# Patient Record
Sex: Female | Born: 1970 | Race: White | Hispanic: No | Marital: Married | Smoking: Former smoker
Health system: Southern US, Community
[De-identification: ages and names within clinical notes are randomized; demographics above are authoritative.]

## PROBLEM LIST (undated history)

## (undated) DIAGNOSIS — J302 Other seasonal allergic rhinitis: Secondary | ICD-10-CM

## (undated) DIAGNOSIS — M1990 Unspecified osteoarthritis, unspecified site: Secondary | ICD-10-CM

## (undated) DIAGNOSIS — F419 Anxiety disorder, unspecified: Secondary | ICD-10-CM

## (undated) DIAGNOSIS — F329 Major depressive disorder, single episode, unspecified: Secondary | ICD-10-CM

## (undated) DIAGNOSIS — R011 Cardiac murmur, unspecified: Secondary | ICD-10-CM

## (undated) DIAGNOSIS — F32A Depression, unspecified: Secondary | ICD-10-CM

## (undated) DIAGNOSIS — Z780 Asymptomatic menopausal state: Secondary | ICD-10-CM

## (undated) DIAGNOSIS — F99 Mental disorder, not otherwise specified: Secondary | ICD-10-CM

## (undated) HISTORY — PX: KNEE ARTHROSCOPY: SHX127

---

## 1999-11-24 ENCOUNTER — Encounter: Payer: Self-pay | Admitting: Emergency Medicine

## 1999-11-25 ENCOUNTER — Encounter: Payer: Self-pay | Admitting: Family Medicine

## 1999-11-25 ENCOUNTER — Inpatient Hospital Stay (HOSPITAL_COMMUNITY): Admission: EM | Admit: 1999-11-25 | Discharge: 1999-11-27 | Payer: Self-pay | Admitting: Emergency Medicine

## 2000-07-31 ENCOUNTER — Other Ambulatory Visit: Admission: RE | Admit: 2000-07-31 | Discharge: 2000-07-31 | Payer: Self-pay | Admitting: Family Medicine

## 2002-09-16 HISTORY — PX: BREAST ENHANCEMENT SURGERY: SHX7

## 2004-05-29 ENCOUNTER — Other Ambulatory Visit: Admission: RE | Admit: 2004-05-29 | Discharge: 2004-05-29 | Payer: Self-pay | Admitting: Family Medicine

## 2007-05-01 ENCOUNTER — Other Ambulatory Visit: Admission: RE | Admit: 2007-05-01 | Discharge: 2007-05-01 | Payer: Self-pay | Admitting: Family Medicine

## 2008-05-24 ENCOUNTER — Other Ambulatory Visit: Admission: RE | Admit: 2008-05-24 | Discharge: 2008-05-24 | Payer: Self-pay | Admitting: Family Medicine

## 2009-07-10 ENCOUNTER — Other Ambulatory Visit: Admission: RE | Admit: 2009-07-10 | Discharge: 2009-07-10 | Payer: Self-pay | Admitting: Family Medicine

## 2011-02-01 NOTE — H&P (Signed)
[place]  Patient:    Sharon Chapman, Sharon Chapman                    MRN: [id number] Adm. Date:  [id number] Attending:  Allean Found CC:         [facility]             Rocco Pauls, M.D., [facility], [place]                         History and Physical  DATE OF BIRTH:  1971-01-20.  CHIEF COMPLAINT:  Nausea and vomiting.  DIAGNOSIS ON ADMISSION:  Leukocytosis, fever, nausea and vomiting.  SUBJECTIVE:  Patient is a 40 year old female who comes in to [facility] ER with a two-day history of nausea and vomiting.  Patient underwent an abdominoplasty and liposuction on November 20, 1999 by Dr. Rocco Pauls, in [place], [place].  This was done as an outpatient and patient returned to [facility] on  November 22, 1999, two days later.  Patient was feeling well except for expected post-surgical pain until November 23, 1999.  Patient was on Percocet for pain and Keflex for postop precautions.  Patient took a Dulcolax on November 23, 1999 p.m. and developed diarrhea.  Fever began Thursday evening.  Patients husband spoke with  Dr. Burnadette Pop office and was told fever was to be expected; however, fever continued to climb up to 102 and 103 today, continued with nausea and vomiting and abdominal pain.  No pain with urination.  Drains are currently in place with serous bloody-appearing fluid.  PAST MEDICAL HISTORY:  Surgeries:  C-sections x 2.  On March 6th of this past week, she had abdominoplasty, liposuction of legs, stomach and buttocks.  She has occasional alcohol use.  She does smoke one and a half packs of cigarettes per ay. No street drugs.  CURRENT MEDICATIONS:  Percocet and Keflex.  SOCIAL HISTORY:  Patient is married, for the past eight years.  Children, ages  and 2.  She is a stay-at-home mom.  FAMILY HISTORY:  Hypertension, but she is not sure who in the family.  No one with coronary artery disease.  She is not sure  about cholesterol.  She has a maternal aunt with diabetes.  Her mom has breast cancer.  REVIEW OF SYSTEMS:  Patient complains of nausea, vomiting, abdominal tenderness and has had fever.  PHYSICAL EXAMINATION:  VITAL SIGNS:  Initial vital signs in the emergency room:  Temperature 99.5, blood pressure 88/54, pulse of 107, respirations of 20.  GENERAL:  She is an alert patient in obvious discomfort.  TMs are normal. Throat is clear.  NECK:  Supple without lymphadenopathy.  HEENT:  Cranial nerves II-XI are intact.  Pupils equal, round and reactive to light.  Extraocular muscles intact bilaterally.  LUNGS:  Clear to auscultation.  HEART:  Regular rate and rhythm without murmur.  ABDOMEN:  Multiple bruises from the previous surgery.  Drains are in place. Skin very warm to touch.  She has no obvious erythema.  Serous fluid is draining. She has positive tenderness on palpation in the right and left upper quadrants ut no hepatosplenomegaly.  There is numbness on palpation over the incision site.   EXTREMITIES:  She has bruising on her thighs and posterior buttocks. NEUROLOGIC:  Reflexes are intact.  PELVIC:  Deferred.  RECTAL:  Deferred.  LABORATORY WORK:  Her white count  is 18,300 with a left shift and 82 segs, 6 lymphocytes, 10 monos.  Her hemoglobin is 12.3.  She has a low potassium at 3.1. Otherwise, the rest of her lab work is normal.  Her blood cultures are pending 2. Her urinalysis was normal.  ASSESSMENT:  Leukocytosis in a 40 year old female, status post abdominoplasty and liposuction.  No source of infection currently found.  She is having nausea and  vomiting.  Etiology could be secondary to Percocet intolerance, gastrointestinal virus or early wound infection.  Patient received 3 liters of fluid in the emergency room.  PLAN:  Blood cultures.  Begin Rocephin -- she had received 1 g of Rocephin in the emergency room and then will continue at 750 mg IV  q.8h.  Continue pain management with morphine IV, nausea management and slowly increase p.o. intake.  She has a low potassium -- add potassium to her fluids.  Will obtain a surgical consult in the normal hours of the day.  She will be placed on telemetry and surgical service s precautionary and patient was encouraged to quit smoking. DD:  11/25/99 TD:  11/25/99 Job: [id number]

## 2011-02-01 NOTE — Consult Note (Signed)
[place]  Patient:    Sharon Chapman, Sharon Chapman                    MRN: [id number] Proc. Date: 11/25/99 Adm. Date:  [id number] Attending:  Rosanne Sack                          Consultation Report  CHIEF COMPLAINT:  Status post abdominoplasty with fever, nausea, and vomiting.  HISTORY OF PRESENT ILLNESS:  A 40 year old woman who is status post mini abdominoplasty with flank and buttock liposuction five days ago in [place], [place].  She did well for two days and then developed nausea, vomiting,  diarrhea worsened with Dulcolax as would be expected.  Fever to 102 degrees. Unable to reach a Engineer, petroleum at [facility] and was admitted early morning 25 November 1999 to [facility].  Plastic surgery consultation requested to evaluate wound.  The patient reports slight improvement since her admission in terms of her symptoms.  PAST MEDICAL HISTORY:  Essentially negative.  SOCIAL HISTORY:  Married, two children, one and one-half pack per day smoker.  PHYSICAL EXAMINATION:  ABDOMEN:  Soft.  Drain is functioning, and the drainage appears appropriate in color and amount.  The wound seems to be healing well.  No evidence of abscess,  fluid accumulation, or cellulitis.  LOWER EXTREMITIES:  No evidence of DVT, no swelling, no tenderness, no pain on dorsiflexion.  IMPRESSION:  Postoperative mini abdominoplasty and liposuction, possible hypovolemia with less than adequate fluid resuscitation at the time of surgery.  Possible atelectasis given the length of surgery and history.  Possible reaction to antibiotics including possible colitis.  RECOMMENDATIONS:  Continue IV hydration, and continue the antibiotics for another 24 hours and then stop the antibiotics unless a positive blood culture.  No wound care per se is needed.  The wound seems to be doing well.  Empty the drain every shift.  Close followup by plastic  surgeon in [facility] next week after discharge from the hospital. DD:  11/26/99 TD:  11/26/99 Job: [id number]

## 2011-02-04 ENCOUNTER — Encounter (INDEPENDENT_AMBULATORY_CARE_PROVIDER_SITE_OTHER): Payer: Self-pay | Admitting: General Surgery

## 2011-07-29 ENCOUNTER — Other Ambulatory Visit (HOSPITAL_COMMUNITY)
Admission: RE | Admit: 2011-07-29 | Discharge: 2011-07-29 | Disposition: A | Discharge status: Home / Self Care | Payer: 59 | Source: Ambulatory Visit | Attending: Family Medicine | Admitting: Family Medicine

## 2011-07-29 ENCOUNTER — Other Ambulatory Visit: Payer: Self-pay | Admitting: Physician Assistant

## 2011-07-29 DIAGNOSIS — Z0000 Encounter for general adult medical examination without abnormal findings: Secondary | ICD-10-CM | POA: Insufficient documentation

## 2012-05-04 ENCOUNTER — Encounter (HOSPITAL_COMMUNITY): Payer: Self-pay | Admitting: Emergency Medicine

## 2012-05-04 ENCOUNTER — Emergency Department (HOSPITAL_COMMUNITY): Payer: 59

## 2012-05-04 ENCOUNTER — Emergency Department (HOSPITAL_COMMUNITY)
Admission: EM | Admit: 2012-05-04 | Discharge: 2012-05-04 | Disposition: A | Discharge status: Home / Self Care | Payer: 59 | Attending: Emergency Medicine | Admitting: Emergency Medicine

## 2012-05-04 DIAGNOSIS — M542 Cervicalgia: Secondary | ICD-10-CM | POA: Insufficient documentation

## 2012-05-04 DIAGNOSIS — Z7982 Long term (current) use of aspirin: Secondary | ICD-10-CM | POA: Insufficient documentation

## 2012-05-04 DIAGNOSIS — IMO0002 Reserved for concepts with insufficient information to code with codable children: Secondary | ICD-10-CM | POA: Insufficient documentation

## 2012-05-04 DIAGNOSIS — M5412 Radiculopathy, cervical region: Secondary | ICD-10-CM | POA: Insufficient documentation

## 2012-05-04 MED ORDER — HYDROMORPHONE HCL PF 2 MG/ML IJ SOLN
2.0000 mg | Freq: Once | INTRAMUSCULAR | Status: AC
  Administered 2012-05-04: 2 mg via INTRAMUSCULAR
  Filled 2012-05-04: qty 1

## 2012-05-04 MED ORDER — KETOROLAC TROMETHAMINE 30 MG/ML IJ SOLN
60.0000 mg | Freq: Once | INTRAMUSCULAR | Status: AC
  Administered 2012-05-04: 60 mg via INTRAMUSCULAR
  Filled 2012-05-04 (×2): qty 1

## 2012-05-04 MED ORDER — OXYCODONE-ACETAMINOPHEN 5-325 MG PO TABS: 1.0000 | ORAL_TABLET | ORAL | Status: AC | PRN

## 2012-05-04 MED ORDER — PREDNISONE 20 MG PO TABS: 40.0000 mg | ORAL_TABLET | Freq: Every day | ORAL | Status: AC

## 2012-05-04 NOTE — ED Notes (Signed)
Pt c/o right neck and shoulder pain with numbness and tingling to right hand x 2 days

## 2012-05-04 NOTE — ED Notes (Signed)
Pt returned from MRI, requesting pain med. Rates pain #5 on pain scale 0-10

## 2012-05-04 NOTE — ED Provider Notes (Signed)
History   This chart was scribed for Sharon Chapman. Bernette Mayers, MD by Sharon Chapman. The patient was seen in room TR07C/TR07C and the patient's care was started at 7:02PM.    CSN: [id number]  Arrival date & time 05/04/12  1823   None     Chief Complaint  Patient presents with  . Shoulder Pain    (Consider location/radiation/quality/duration/timing/severity/associated sxs/prior treatment) The history is provided by the spouse and the patient. No language interpreter was used.   Sharon Chapman is a 41 y.o. female who presents to the Emergency Department complaining of constant, moderate right shoulder pain that radiates to the neck and down the arm with numbness and tinging in the right hand with an onset 2 days ago. Pt has had this pain for 2 months. Pt has been recently travelling in [place] the past 2 weeks and the pain progressively got worse around onset. Pt went to Urgent Care recently and received XRs. No trauma or injury to the affected area. Ibuprofen does not alleviate the pain. No HA, fever, neck pain, sore throat, rash, back pain, CP, SOB, abd pain, n/v/d, dysuria, or extremity edema or weakness. No known allergies. No other pertinent medical symptoms.   History reviewed. No pertinent past medical history.  Past Surgical History  Procedure Date  . Breast enhancement surgery 2004  . Cesarean section     History reviewed. No pertinent family history.  History  Substance Use Topics  . Smoking status: Never Smoker   . Smokeless tobacco: Not on file  . Alcohol Use: Yes     couple glasses of wine several days of week    OB History    Grav Para Term Preterm Abortions TAB SAB Ect Mult Living                  Review of Systems 10 Systems reviewed and all are negative for acute change except as noted in the HPI.   Allergies  Review of patient's allergies indicates no known allergies.  Home Medications   Current Outpatient Rx  Name Route Sig Dispense Refill  .  ASPIRIN 325 MG PO TABS Oral Take 650 mg by mouth daily.    Marland Kitchen ESCITALOPRAM OXALATE 10 MG PO TABS Oral Take 10 mg by mouth daily.    . IBUPROFEN 200 MG PO TABS Oral Take 600 mg by mouth every 6 (six) hours as needed. For pain      BP 176/88  Pulse 63  Temp 98.2 F (36.8 C) (Oral)  Resp 20  SpO2 100%  Physical Exam  Nursing note and vitals reviewed. Constitutional: She is oriented to person, place, and time. She appears well-developed and well-nourished.  HENT:  Head: Normocephalic and atraumatic.  Eyes: EOM are normal. Pupils are equal, round, and reactive to light.  Neck: Normal range of motion. Neck supple.  Cardiovascular: Normal rate, normal heart sounds and intact distal pulses.   Pulmonary/Chest: Effort normal and breath sounds normal.  Abdominal: Bowel sounds are normal. She exhibits no distension. There is no tenderness.  Musculoskeletal: Normal range of motion. She exhibits tenderness (Right shoulder and neck muscular tenderness). She exhibits no edema.  Neurological: She is alert and oriented to person, place, and time. She has normal strength. No cranial nerve deficit or sensory deficit.       Nml strength and sensation in all extremities.  Skin: Skin is warm and dry. No rash noted.  Psychiatric: She has a normal mood and affect.  ED Course  Procedures (including critical care time)  DIAGNOSTIC STUDIES: Oxygen Saturation is 100% on room air, normal by my interpretation.    COORDINATION OF CARE:  7:15PM - Toradol and dilaudid will be ordered for the pt.  WEST, Sharon Chapman 8:08 PM Patient moved to CDU holding for MRI c-spine.  Pt with weeks to months of right radicular symptoms, concern per Dr Bernette Chapman of abnormality of c-spine film.  Pt reports pain is currently well controlled, denies any weakness of the extremity.  On exam, c-spine and t-spine are nontender, right arm with 5/5 strength, grip strengths equal, distal pulses intact.      Labs Reviewed - No data to  display No results found.   No diagnosis found.    MDM  Pt with radicular pain in R arm, plain xray done at urgent care shows ?compression fracture. No recent trauma but fell several months ago. Move to CDU for MRI. Discussed with Sharon Dredge, PA  I personally performed the services described in the documentation, which were scribed in my presence. The recorded information has been reviewed and considered.          Sharon Chapman. Bernette Mayers, MD 05/04/12 2027

## 2012-05-04 NOTE — ED Notes (Signed)
Pt to MRI

## 2012-05-04 NOTE — ED Provider Notes (Signed)
Patient moved to CDU holding for MRI c-spine.  Please see my initial note which in within Dr Janese Banks original note.  Pt with right sided cervical radiculopathy with concern for abnormal xray.  No weakness no exam.  Pain well controlled in ED.  Awaiting MRI.    10:44 PM Discussed MRI results with patient and her family member.  Dr Bernette Mayers has also reviewed MRI.  Answered all question.  Pt to be d/c home with symptomatic medications, neurosurgery follow up.  Pt given return precautions.  Pt verbalizes understanding and agrees with plan.     No results found for this or any previous visit. Mr Cervical Spine Wo Contrast  05/04/2012  *RADIOLOGY REPORT*  Clinical Data: Right shoulder and arm pain.  Question fracture on outside radiographs which are not available.  MRI CERVICAL SPINE WITHOUT CONTRAST  Technique:  Multiplanar and multiecho pulse sequences of the cervical spine, to include the craniocervical junction and cervicothoracic junction, were obtained according to standard protocol without intravenous contrast.  Comparison: None.  Findings: Mild cervical kyphosis.  Negative for fracture.  Negative for mass.  No bone marrow edema.  Spinal cord signal is normal.  C2-3:  Negative  C3-4:  Small central disc protrusion without spinal stenosis or cord deformity.  C4-5:  Small central disc protrusion without spinal stenosis.  C5-6:  Moderate to severe disc degeneration and spondylosis with diffuse uncinate spurring.  The patient has a generous spinal canal and there is no significant spinal stenosis.  There is moderate foraminal encroachment bilaterally due to spurring.  C6-7:  Diffuse disc protrusion and associated osteophyte formation causing mild spinal stenosis.  Mild to moderate foraminal stenosis bilaterally.  C7-T1:  Negative  IMPRESSION: Negative for fracture or mass.  Small central disc protrusions at C3-4 and C4-5.  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  Diffuse disc protrusion and  spondylosis at C6-7 with mild spinal stenosis and mild to moderate foraminal encroachment bilaterally.   Original Report Authenticated By: Camelia Phenes, M.D.       [place], [place] 05/04/12 2246

## 2012-05-04 NOTE — ED Notes (Signed)
Sent from MD's office with xrays. C/o intermittent right shoulder pain radiating into RUE x 2-3 months with numbness & tingling. Reports pain has been constant since yesterday.  Strength + BUE, states worst of the pain stops at mid forearm. C/o pins & needles to right fingertips. Pt cannot recall any injury

## 2012-05-15 ENCOUNTER — Other Ambulatory Visit: Payer: Self-pay | Admitting: Neurosurgery

## 2012-05-21 ENCOUNTER — Encounter (HOSPITAL_COMMUNITY): Payer: Self-pay | Admitting: Pharmacy Technician

## 2012-05-25 ENCOUNTER — Encounter (HOSPITAL_COMMUNITY): Payer: Self-pay

## 2012-05-25 ENCOUNTER — Encounter (HOSPITAL_COMMUNITY)
Admission: RE | Admit: 2012-05-25 | Discharge: 2012-05-25 | Disposition: A | Discharge status: Home / Self Care | Payer: 59 | Source: Ambulatory Visit | Attending: Neurosurgery | Admitting: Neurosurgery

## 2012-05-25 HISTORY — DX: Depression, unspecified: F32.A

## 2012-05-25 HISTORY — DX: Anxiety disorder, unspecified: F41.9

## 2012-05-25 HISTORY — DX: Unspecified osteoarthritis, unspecified site: M19.90

## 2012-05-25 HISTORY — DX: Cardiac murmur, unspecified: R01.1

## 2012-05-25 HISTORY — DX: Major depressive disorder, single episode, unspecified: F32.9

## 2012-05-25 HISTORY — DX: Mental disorder, not otherwise specified: F99

## 2012-05-25 LAB — BASIC METABOLIC PANEL
BUN: 7 mg/dL (ref 6–23)
Chloride: 103 mEq/L (ref 96–112)
GFR calc Af Amer: >90 mL/min (ref >90)
GFR calc non Af Amer: >90 mL/min (ref >90)
Glucose, Bld: 79 mg/dL (ref 70–99)
Potassium: 4.5 mEq/L (ref 3.5–5.1)
Sodium: 139 mEq/L (ref 135–145)

## 2012-05-25 LAB — CBC
HCT: 39.7 % (ref 36.0–46.0)
Hemoglobin: 14 g/dL (ref 12.0–15.0)
MCHC: 35.3 g/dL (ref 30.0–36.0)
RBC: 4.02 MIL/uL (ref 3.87–5.11)

## 2012-05-25 LAB — HCG, SERUM, QUALITATIVE: Preg, Serum: NEGATIVE

## 2012-05-25 NOTE — Pre-Procedure Instructions (Signed)
19 Sharon Chapman  05/25/2012   Your procedure is scheduled on:  05/27/2012  Report to [facility] at 8:30 AM.  Call this number if you have problems the morning of surgery: [phone]   Remember:   Do not eat food or drink liquid :After Midnight. TUESDAY      Take these medicines the morning of surgery with A SIP OF WATER: Lexapro, Oxycodone, Valium   Do not wear jewelry, make-up or nail polish.  Do not wear lotions, powders, or perfumes. You may wear deodorant.  Do not shave 48 hours prior to surgery. Men may shave face and neck.  Do not bring valuables to the hospital.  Contacts, dentures or bridgework may not be worn into surgery.  Leave suitcase in the car. After surgery it may be brought to your room.  For patients admitted to the hospital, checkout time is 11:00 AM the day of discharge.   Patients discharged the day of surgery will not be allowed to drive home.  Name and phone number of your driver: /w spouse  Special Instructions: CHG Shower Use Special Wash: 1/2 bottle night before surgery and 1/2 bottle morning of surgery.   Please read over the following fact sheets that you were given: Pain Booklet, Coughing and Deep Breathing, MRSA Information and Surgical Site Infection Prevention

## 2012-05-26 MED ORDER — CEFAZOLIN SODIUM-DEXTROSE 2-3 GM-% IV SOLR
2.0000 g | INTRAVENOUS | Status: AC
  Administered 2012-05-27: 2 g via INTRAVENOUS
  Filled 2012-05-26: qty 50

## 2012-05-27 ENCOUNTER — Encounter (HOSPITAL_COMMUNITY)
Admission: RE | Disposition: A | Discharge status: Home / Self Care | Payer: Self-pay | Source: Ambulatory Visit | Attending: Neurosurgery

## 2012-05-27 ENCOUNTER — Ambulatory Visit (HOSPITAL_COMMUNITY): Payer: 59 | Admitting: Critical Care Medicine

## 2012-05-27 ENCOUNTER — Ambulatory Visit (HOSPITAL_COMMUNITY): Payer: 59

## 2012-05-27 ENCOUNTER — Encounter (HOSPITAL_COMMUNITY): Payer: Self-pay | Admitting: Critical Care Medicine

## 2012-05-27 ENCOUNTER — Encounter (HOSPITAL_COMMUNITY): Payer: Self-pay | Admitting: *Deleted

## 2012-05-27 ENCOUNTER — Ambulatory Visit (HOSPITAL_COMMUNITY)
Admission: RE | Admit: 2012-05-27 | Discharge: 2012-05-27 | Disposition: A | Discharge status: Home / Self Care | Payer: 59 | Source: Ambulatory Visit | Attending: Neurosurgery | Admitting: Neurosurgery

## 2012-05-27 DIAGNOSIS — M5000 Cervical disc disorder with myelopathy, unspecified cervical region: Secondary | ICD-10-CM | POA: Insufficient documentation

## 2012-05-27 DIAGNOSIS — M4712 Other spondylosis with myelopathy, cervical region: Secondary | ICD-10-CM | POA: Insufficient documentation

## 2012-05-27 DIAGNOSIS — F329 Major depressive disorder, single episode, unspecified: Secondary | ICD-10-CM | POA: Insufficient documentation

## 2012-05-27 DIAGNOSIS — Z01812 Encounter for preprocedural laboratory examination: Secondary | ICD-10-CM | POA: Insufficient documentation

## 2012-05-27 DIAGNOSIS — M5020 Other cervical disc displacement, unspecified cervical region: Secondary | ICD-10-CM | POA: Diagnosis present

## 2012-05-27 DIAGNOSIS — F17200 Nicotine dependence, unspecified, uncomplicated: Secondary | ICD-10-CM | POA: Insufficient documentation

## 2012-05-27 DIAGNOSIS — F3289 Other specified depressive episodes: Secondary | ICD-10-CM | POA: Insufficient documentation

## 2012-05-27 DIAGNOSIS — F411 Generalized anxiety disorder: Secondary | ICD-10-CM | POA: Insufficient documentation

## 2012-05-27 HISTORY — PX: ANTERIOR CERVICAL DECOMP/DISCECTOMY FUSION: SHX1161

## 2012-05-27 SURGERY — ANTERIOR CERVICAL DECOMPRESSION/DISCECTOMY FUSION 2 LEVELS
Anesthesia: General | Site: Neck

## 2012-05-27 MED ORDER — DIAZEPAM 5 MG PO TABS
5.0000 mg | ORAL_TABLET | Freq: Four times a day (QID) | ORAL | Status: DC | PRN
  Administered 2012-05-27: 5 mg via ORAL
  Filled 2012-05-27: qty 1

## 2012-05-27 MED ORDER — ACETAMINOPHEN 325 MG PO TABS: 650.0000 mg | ORAL_TABLET | ORAL | Status: DC | PRN

## 2012-05-27 MED ORDER — DEXAMETHASONE SODIUM PHOSPHATE 4 MG/ML IJ SOLN: 4.0000 mg | Freq: Four times a day (QID) | INTRAMUSCULAR | Status: DC

## 2012-05-27 MED ORDER — BACITRACIN ZINC 500 UNIT/GM EX OINT
TOPICAL_OINTMENT | CUTANEOUS | Status: DC | PRN
  Administered 2012-05-27: 1 via TOPICAL

## 2012-05-27 MED ORDER — LIDOCAINE HCL 4 % MT SOLN
OROMUCOSAL | Status: DC | PRN
  Administered 2012-05-27: 4 mL via TOPICAL

## 2012-05-27 MED ORDER — MIDAZOLAM HCL 5 MG/5ML IJ SOLN
INTRAMUSCULAR | Status: DC | PRN
  Administered 2012-05-27: 2 mg via INTRAVENOUS

## 2012-05-27 MED ORDER — OXYCODONE-ACETAMINOPHEN 10-325 MG PO TABS: 1.0000 | ORAL_TABLET | ORAL | Status: DC | PRN

## 2012-05-27 MED ORDER — HEMOSTATIC AGENTS (NO CHARGE) OPTIME
TOPICAL | Status: DC | PRN
Start: 2012-05-27 — End: 2012-05-27
  Administered 2012-05-27: 1 via TOPICAL

## 2012-05-27 MED ORDER — OXYCODONE HCL 5 MG PO TABS: 5.0000 mg | ORAL_TABLET | ORAL | Status: DC | PRN

## 2012-05-27 MED ORDER — NEOSTIGMINE METHYLSULFATE 1 MG/ML IJ SOLN
INTRAMUSCULAR | Status: DC | PRN
  Administered 2012-05-27: 4 mg via INTRAVENOUS

## 2012-05-27 MED ORDER — GLYCOPYRROLATE 0.2 MG/ML IJ SOLN
INTRAMUSCULAR | Status: DC | PRN
  Administered 2012-05-27: 0.6 mg via INTRAVENOUS
  Administered 2012-05-27: 0.2 mg via INTRAVENOUS

## 2012-05-27 MED ORDER — HYDROCODONE-ACETAMINOPHEN 5-325 MG PO TABS: 1.0000 | ORAL_TABLET | ORAL | Status: DC | PRN

## 2012-05-27 MED ORDER — OXYCODONE-ACETAMINOPHEN 5-325 MG PO TABS
1.0000 | ORAL_TABLET | ORAL | Status: DC | PRN
  Administered 2012-05-27: 2 via ORAL
  Filled 2012-05-27: qty 2

## 2012-05-27 MED ORDER — PHENOL 1.4 % MT LIQD: 1.0000 | OROMUCOSAL | Status: DC | PRN

## 2012-05-27 MED ORDER — 0.9 % SODIUM CHLORIDE (POUR BTL) OPTIME
TOPICAL | Status: DC | PRN
  Administered 2012-05-27: 1000 mL

## 2012-05-27 MED ORDER — MORPHINE SULFATE 2 MG/ML IJ SOLN: 1.0000 mg | INTRAMUSCULAR | Status: DC | PRN

## 2012-05-27 MED ORDER — HYDROMORPHONE HCL PF 1 MG/ML IJ SOLN
INTRAMUSCULAR | Status: AC
  Filled 2012-05-27: qty 1

## 2012-05-27 MED ORDER — ROCURONIUM BROMIDE 100 MG/10ML IV SOLN
INTRAVENOUS | Status: DC | PRN
  Administered 2012-05-27: 50 mg via INTRAVENOUS

## 2012-05-27 MED ORDER — DEXAMETHASONE SODIUM PHOSPHATE 4 MG/ML IJ SOLN
INTRAMUSCULAR | Status: DC | PRN
  Administered 2012-05-27: 4 mg via INTRAVENOUS

## 2012-05-27 MED ORDER — ESCITALOPRAM OXALATE 10 MG PO TABS
10.0000 mg | ORAL_TABLET | Freq: Every day | ORAL | Status: DC
  Filled 2012-05-27: qty 1

## 2012-05-27 MED ORDER — DEXAMETHASONE 4 MG PO TABS
4.0000 mg | ORAL_TABLET | Freq: Four times a day (QID) | ORAL | Status: DC
  Administered 2012-05-27: 4 mg via ORAL
  Filled 2012-05-27: qty 1

## 2012-05-27 MED ORDER — SODIUM CHLORIDE 0.9 % IV SOLN
INTRAVENOUS | Status: AC
  Filled 2012-05-27: qty 500

## 2012-05-27 MED ORDER — OXYCODONE HCL 5 MG PO TABS: 5.0000 mg | ORAL_TABLET | Freq: Once | ORAL | Status: DC | PRN

## 2012-05-27 MED ORDER — ONDANSETRON HCL 4 MG/2ML IJ SOLN: 4.0000 mg | Freq: Once | INTRAMUSCULAR | Status: DC | PRN

## 2012-05-27 MED ORDER — ARTIFICIAL TEARS OP OINT
TOPICAL_OINTMENT | OPHTHALMIC | Status: DC | PRN
  Administered 2012-05-27: 1 via OPHTHALMIC

## 2012-05-27 MED ORDER — CEFAZOLIN SODIUM-DEXTROSE 2-3 GM-% IV SOLR
2.0000 g | Freq: Three times a day (TID) | INTRAVENOUS | Status: DC
  Filled 2012-05-27 (×2): qty 50

## 2012-05-27 MED ORDER — MEPERIDINE HCL 25 MG/ML IJ SOLN: 6.2500 mg | INTRAMUSCULAR | Status: DC | PRN

## 2012-05-27 MED ORDER — BUPIVACAINE-EPINEPHRINE PF 0.5-1:200000 % IJ SOLN
INTRAMUSCULAR | Status: DC | PRN
  Administered 2012-05-27: 10 mL

## 2012-05-27 MED ORDER — ACETAMINOPHEN 650 MG RE SUPP: 650.0000 mg | RECTAL | Status: DC | PRN

## 2012-05-27 MED ORDER — THROMBIN 5000 UNITS EX SOLR
CUTANEOUS | Status: DC | PRN
  Administered 2012-05-27 (×2): 5000 [IU] via TOPICAL

## 2012-05-27 MED ORDER — MENTHOL 3 MG MT LOZG: 1.0000 | LOZENGE | OROMUCOSAL | Status: DC | PRN

## 2012-05-27 MED ORDER — BACITRACIN 50000 UNITS IM SOLR
INTRAMUSCULAR | Status: AC
  Filled 2012-05-27: qty 1

## 2012-05-27 MED ORDER — OXYCODONE-ACETAMINOPHEN 5-325 MG PO TABS: 1.0000 | ORAL_TABLET | ORAL | Status: DC | PRN

## 2012-05-27 MED ORDER — LIDOCAINE HCL (CARDIAC) 20 MG/ML IV SOLN
INTRAVENOUS | Status: DC | PRN
  Administered 2012-05-27: 100 mg via INTRAVENOUS

## 2012-05-27 MED ORDER — OXYCODONE HCL 5 MG/5ML PO SOLN: 5.0000 mg | Freq: Once | ORAL | Status: DC | PRN

## 2012-05-27 MED ORDER — ZOLPIDEM TARTRATE 5 MG PO TABS: 5.0000 mg | ORAL_TABLET | Freq: Every evening | ORAL | Status: DC | PRN

## 2012-05-27 MED ORDER — FENTANYL CITRATE 0.05 MG/ML IJ SOLN
INTRAMUSCULAR | Status: DC | PRN
  Administered 2012-05-27 (×3): 50 ug via INTRAVENOUS
  Administered 2012-05-27: 100 ug via INTRAVENOUS
  Administered 2012-05-27 (×5): 50 ug via INTRAVENOUS

## 2012-05-27 MED ORDER — DOCUSATE SODIUM 100 MG PO CAPS
100.0000 mg | ORAL_CAPSULE | Freq: Two times a day (BID) | ORAL | Status: DC
  Administered 2012-05-27: 100 mg via ORAL
  Filled 2012-05-27: qty 1

## 2012-05-27 MED ORDER — LACTATED RINGERS IV SOLN
INTRAVENOUS | Status: DC | PRN
  Administered 2012-05-27 (×2): via INTRAVENOUS

## 2012-05-27 MED ORDER — ONDANSETRON HCL 4 MG/2ML IJ SOLN: 4.0000 mg | INTRAMUSCULAR | Status: DC | PRN

## 2012-05-27 MED ORDER — ONDANSETRON HCL 4 MG/2ML IJ SOLN
INTRAMUSCULAR | Status: DC | PRN
  Administered 2012-05-27 (×2): 4 mg via INTRAVENOUS

## 2012-05-27 MED ORDER — EPHEDRINE SULFATE 50 MG/ML IJ SOLN
INTRAMUSCULAR | Status: DC | PRN
  Administered 2012-05-27: 10 mg via INTRAVENOUS

## 2012-05-27 MED ORDER — SODIUM CHLORIDE 0.9 % IR SOLN
Status: DC | PRN
  Administered 2012-05-27: 12:00:00

## 2012-05-27 MED ORDER — OXYCODONE-ACETAMINOPHEN 10-325 MG PO TABS: 1.0000 | ORAL_TABLET | ORAL | Status: AC | PRN

## 2012-05-27 MED ORDER — VECURONIUM BROMIDE 10 MG IV SOLR
INTRAVENOUS | Status: DC | PRN
Start: 2012-05-27 — End: 2012-05-27
  Administered 2012-05-27 (×2): 1 mg via INTRAVENOUS

## 2012-05-27 MED ORDER — HYDROMORPHONE HCL PF 1 MG/ML IJ SOLN
0.2500 mg | INTRAMUSCULAR | Status: DC | PRN
  Administered 2012-05-27: 0.5 mg via INTRAVENOUS

## 2012-05-27 MED ORDER — DSS 100 MG PO CAPS: 100.0000 mg | ORAL_CAPSULE | Freq: Two times a day (BID) | ORAL | Status: AC

## 2012-05-27 MED ORDER — LACTATED RINGERS IV SOLN: INTRAVENOUS | Status: DC

## 2012-05-27 MED ORDER — PROPOFOL 10 MG/ML IV BOLUS
INTRAVENOUS | Status: DC | PRN
  Administered 2012-05-27: 120 mg via INTRAVENOUS

## 2012-05-27 SURGICAL SUPPLY — 64 items
APL SKNCLS STERI-STRIP NONHPOA (GAUZE/BANDAGES/DRESSINGS) ×1
BAG DECANTER FOR FLEXI CONT (MISCELLANEOUS) ×2 IMPLANT
BENZOIN TINCTURE PRP APPL 2/3 (GAUZE/BANDAGES/DRESSINGS) ×2 IMPLANT
BIT DRILL SPINE QC 12 (BIT) ×1 IMPLANT
BLADE SURG 15 STRL LF DISP TIS (BLADE) ×1 IMPLANT
BLADE SURG 15 STRL SS (BLADE) ×2
BLADE ULTRA TIP 2M (BLADE) ×2 IMPLANT
BRUSH SCRUB EZ PLAIN DRY (MISCELLANEOUS) ×2 IMPLANT
BUR BARREL STRAIGHT FLUTE 4.0 (BURR) ×2 IMPLANT
BUR MATCHSTICK NEURO 3.0 LAGG (BURR) ×2 IMPLANT
CANISTER SUCTION 2500CC (MISCELLANEOUS) ×2 IMPLANT
CLOTH BEACON ORANGE TIMEOUT ST (SAFETY) ×2 IMPLANT
CONT SPEC 4OZ CLIKSEAL STRL BL (MISCELLANEOUS) ×2 IMPLANT
COVER MAYO STAND STRL (DRAPES) ×2 IMPLANT
DEVICE FUSION VIST S 14X14X6MM (Trauma) ×2 IMPLANT
DRAPE LAPAROTOMY 100X72 PEDS (DRAPES) ×2 IMPLANT
DRAPE MICROSCOPE LEICA (MISCELLANEOUS) IMPLANT
DRAPE POUCH INSTRU U-SHP 10X18 (DRAPES) ×2 IMPLANT
DRAPE SURG 17X23 STRL (DRAPES) ×4 IMPLANT
ELECT REM PT RETURN 9FT ADLT (ELECTROSURGICAL) ×2
ELECTRODE REM PT RTRN 9FT ADLT (ELECTROSURGICAL) ×1 IMPLANT
GAUZE SPONGE 4X4 16PLY XRAY LF (GAUZE/BANDAGES/DRESSINGS) ×2 IMPLANT
GLOVE BIO SURGEON STRL SZ8 (GLOVE) ×2 IMPLANT
GLOVE BIO SURGEON STRL SZ8.5 (GLOVE) ×2 IMPLANT
GLOVE BIOGEL PI IND STRL 7.5 (GLOVE) IMPLANT
GLOVE BIOGEL PI IND STRL 8 (GLOVE) ×2 IMPLANT
GLOVE BIOGEL PI INDICATOR 7.5 (GLOVE) ×1
GLOVE BIOGEL PI INDICATOR 8 (GLOVE) ×2
GLOVE ECLIPSE 7.5 STRL STRAW (GLOVE) ×10 IMPLANT
GLOVE EXAM NITRILE LRG STRL (GLOVE) IMPLANT
GLOVE EXAM NITRILE MD LF STRL (GLOVE) ×2 IMPLANT
GLOVE EXAM NITRILE XL STR (GLOVE) IMPLANT
GLOVE EXAM NITRILE XS STR PU (GLOVE) IMPLANT
GLOVE SS BIOGEL STRL SZ 8 (GLOVE) ×1 IMPLANT
GLOVE SUPERSENSE BIOGEL SZ 8 (GLOVE) ×1
GOWN BRE IMP SLV AUR LG STRL (GOWN DISPOSABLE) IMPLANT
GOWN BRE IMP SLV AUR XL STRL (GOWN DISPOSABLE) ×3 IMPLANT
GOWN STRL REIN 2XL LVL4 (GOWN DISPOSABLE) ×2 IMPLANT
KIT BASIN OR (CUSTOM PROCEDURE TRAY) ×2 IMPLANT
KIT ROOM TURNOVER OR (KITS) ×2 IMPLANT
MARKER SKIN DUAL TIP RULER LAB (MISCELLANEOUS) ×2 IMPLANT
NDL SPNL 18GX3.5 QUINCKE PK (NEEDLE) ×1 IMPLANT
NEEDLE HYPO 22GX1.5 SAFETY (NEEDLE) ×2 IMPLANT
NEEDLE SPNL 18GX3.5 QUINCKE PK (NEEDLE) ×2 IMPLANT
NS IRRIG 1000ML POUR BTL (IV SOLUTION) ×2 IMPLANT
PACK LAMINECTOMY NEURO (CUSTOM PROCEDURE TRAY) ×2 IMPLANT
PIN DISTRACTION 14MM (PIN) ×4 IMPLANT
PLATE ANT CERV XTEND 2 LV 28 (Plate) ×1 IMPLANT
PUTTY 5ML ACTIFUSE ABX (Putty) ×1 IMPLANT
RUBBERBAND STERILE (MISCELLANEOUS) IMPLANT
SCREW XTD VAR 4.2 SELF TAP 12 (Screw) ×6 IMPLANT
SPONGE GAUZE 4X4 12PLY (GAUZE/BANDAGES/DRESSINGS) ×2 IMPLANT
SPONGE INTESTINAL PEANUT (DISPOSABLE) ×4 IMPLANT
SPONGE SURGIFOAM ABS GEL SZ50 (HEMOSTASIS) ×2 IMPLANT
STRIP CLOSURE SKIN 1/2X4 (GAUZE/BANDAGES/DRESSINGS) ×2 IMPLANT
SUT VIC AB 0 CT1 27 (SUTURE) ×2
SUT VIC AB 0 CT1 27XBRD ANTBC (SUTURE) ×1 IMPLANT
SUT VIC AB 3-0 SH 8-18 (SUTURE) ×4 IMPLANT
SYR 20ML ECCENTRIC (SYRINGE) ×2 IMPLANT
TAPE CLOTH SURG 4X10 WHT LF (GAUZE/BANDAGES/DRESSINGS) ×1 IMPLANT
TOWEL OR 17X24 6PK STRL BLUE (TOWEL DISPOSABLE) ×2 IMPLANT
TOWEL OR 17X26 10 PK STRL BLUE (TOWEL DISPOSABLE) ×2 IMPLANT
VISTA S O 14X14X6MM (Trauma) ×4 IMPLANT
WATER STERILE IRR 1000ML POUR (IV SOLUTION) ×2 IMPLANT

## 2012-05-27 NOTE — Anesthesia Preprocedure Evaluation (Addendum)
Anesthesia Evaluation  Patient identified by MRN, date of birth, ID band Patient awake    Reviewed: Allergy & Precautions, H&P , NPO status , Patient's Chart, lab work & pertinent test results  Airway Mallampati: I TM Distance: >3 FB Neck ROM: Full    Dental  (+) Dental Advisory Given   Pulmonary Current Smoker,          Cardiovascular     Neuro/Psych PSYCHIATRIC DISORDERS Anxiety Depression    GI/Hepatic   Endo/Other    Renal/GU      Musculoskeletal   Abdominal   Peds  Hematology   Anesthesia Other Findings   Reproductive/Obstetrics                          Anesthesia Physical Anesthesia Plan  ASA: II  Anesthesia Plan: General   Post-op Pain Management:    Induction: Intravenous  Airway Management Planned: Oral ETT  Additional Equipment:   Intra-op Plan:   Post-operative Plan: Extubation in OR  Informed Consent: I have reviewed the patients History and Physical, chart, labs and discussed the procedure including the risks, benefits and alternatives for the proposed anesthesia with the patient or authorized representative who has indicated his/her understanding and acceptance.   Dental advisory given  Plan Discussed with: Surgeon and CRNA  Anesthesia Plan Comments:        Anesthesia Quick Evaluation

## 2012-05-27 NOTE — Anesthesia Procedure Notes (Signed)
Procedure Name: Intubation Date/Time: 05/27/2012 10:56 AM Performed by: Elon Alas Pre-anesthesia Checklist: Emergency Drugs available, Patient identified, Timeout performed, Suction available and Patient being monitored Patient Re-evaluated:Patient Re-evaluated prior to inductionOxygen Delivery Method: Circle system utilized Preoxygenation: Pre-oxygenation with 100% oxygen Intubation Type: IV induction Ventilation: Mask ventilation without difficulty Laryngoscope Size: Mac and 3 Grade View: Grade I Tube type: Oral Tube size: 7.0 mm Number of attempts: 1 Airway Equipment and Method: Stylet and LTA kit utilized Placement Confirmation: positive ETCO2,  ETT inserted through vocal cords under direct vision and breath sounds checked- equal and bilateral Secured at: 21 cm Tube secured with: Tape Dental Injury: Teeth and Oropharynx as per pre-operative assessment

## 2012-05-27 NOTE — Discharge Summary (Signed)
  Physician Discharge Summary  Patient ID: Sharon Chapman MRN: [id number] DOB/AGE: Jan 24, 1971 41 y.o.  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 On that day I performed a C5-6 and C6-7 intracervical discectomy fusion and plating. The surgery went well. The patient's postoperative course was unremarkable and on the afternoon of surgery the patient requested discharge to home. The patient was given oral and written discharge instructions. All her questions were answered.  Consults: None Significant Diagnostic Studies: None Treatments: C5-6 and C6-7 anterior cervicectomy, fusion, and plating. Discharge Exam: Blood pressure 133/88, pulse 74, temperature 97 F (36.1 C), temperature source Oral, resp. rate 16, last menstrual period 05/26/2012, SpO2 94.00%. The patient is alert and pleasant. She looks well. Her dressing is clean and dry. There is no evidence of hematoma or shift. Her strength is normal.  Disposition: Home     Medication List     As of 05/27/2012  4:02 PM    TAKE these medications         diazepam 5 MG tablet   Commonly known as: VALIUM   Take 5 mg by mouth every 6 (six) hours as needed.      DSS 100 MG Caps   Take 100 mg by mouth 2 (two) times daily.      escitalopram 10 MG tablet   Commonly known as: LEXAPRO   Take 10 mg by mouth daily before breakfast.      oxyCODONE-acetaminophen 10-325 MG per tablet   Commonly known as: PERCOCET   Take 1 tablet by mouth every 4 (four) hours as needed for pain.      oxyCODONE-acetaminophen 10-325 MG per tablet   Commonly known as: PERCOCET   Take 1 tablet by mouth every 4 (four) hours as needed. For pain         Signed: Cristi Loron 05/27/2012, 4:02 PM

## 2012-05-27 NOTE — Anesthesia Postprocedure Evaluation (Signed)
Anesthesia Post Note  Patient: Sharon Chapman  Procedure(s) Performed: Procedure(s) (LRB): ANTERIOR CERVICAL DECOMPRESSION/DISCECTOMY FUSION 2 LEVELS (N/A)  Anesthesia type: general  Patient location: PACU  Post pain: Pain level controlled  Post assessment: Patient's Cardiovascular Status Stable  Last Vitals:  Filed Vitals:   05/27/12 1406  BP: 130/81  Pulse: 67  Temp:   Resp: 18    Post vital signs: Reviewed and stable  Level of consciousness: sedated  Complications: No apparent anesthesia complications

## 2012-05-27 NOTE — Progress Notes (Signed)
Patient ID: Sharon Chapman, female   DOB: 1971-03-15, 41 y.o.   MRN: [id number] Subjective:  The patient is somnolent but easily arousable. She looks well. She is in no apparent distress.  Objective: Vital signs in last 24 hours: Temp:  [97.4 F (36.3 C)-97.8 F (36.6 C)] 97.4 F (36.3 C) (09/11 1334) Pulse Rate:  [76] 76  (09/11 0844) Resp:  [18] 18  (09/11 0844) BP: (121)/(82) 121/82 mmHg (09/11 0844) SpO2:  [97 %] 97 % (09/11 0844)  Intake/Output from previous day:   Intake/Output this shift: Total I/O In: 1500 [I.V.:1500] Out: 100 [Blood:100]  Physical exam the patient is somnolent but easily arousable. She is moving all 4 extremities well. Her dressing is clean and dry without evidence of hematoma or shift.  Lab Results:  [facility] 05/25/12 1355  WBC 10.4  HGB 14.0  HCT 39.7  PLT 310   BMET  Basename 05/25/12 1355  NA 139  K 4.5  CL 103  CO2 27  GLUCOSE 79  BUN 7  CREATININE 0.66  CALCIUM 9.3    Studies/Results: No results found.  Assessment/Plan: The patient is doing well.  LOS: 0 days     Delina Kruczek D 05/27/2012, 1:39 PM

## 2012-05-27 NOTE — Progress Notes (Signed)
PT. AMBULATING IN HALL WITH STEADY GAIT, TOLERATING DIET AND PO MEDICATIONS.  LAST V/S TEMP 97.7, R=16, P=61, B/P=129/86, SAT 97% ON ROOM AIR.  PT. VERBALIZED UNDERSTANDING OF D/C ORDERS. PT. ASSISTED TO CAR BY STAFF.  SPOUSE ASSISTING PT. HOME. CHRIS Adiyah Lame RN

## 2012-05-27 NOTE — H&P (Signed)
Subjective: The patient is a 41 year old white female who has been suffering from neck and right arm pain. She has failed medical management and was worked up with cervical MRI. This demonstrates she has significant spondylosis and stenosis C5-6 as well as a ruptured disc at C6-7. I discussed the various treatment option with the patient including surgery. She has weighed the risks, benefits, and alternatives surgery decided proceed with a C5-6 and C6-7 anterior cervical discectomy fusion and plating.   Past Medical History  Diagnosis Date  . Mental disorder   . Depression   . Anxiety     anxiety, ADD mixed   . Heart murmur     h/o suppsed heart murmur, seen cardiologist about 20 yrs. ago, no f/u since, sees [facility]  currently, no recenmt mention of heart murmur  . Arthritis     cervical myelopathy , spondylosis, recently reports low back pain   . Heart murmur     told many yrs. ago that she had a heart murmur, doesn't ever remember having any test related  to the finding     Past Surgical History  Procedure Date  . Breast enhancement surgery 2004  . Cesarean section     x2 , spinal , or epidural     No Known Allergies  History  Substance Use Topics  . Smoking status: Current Every Day Smoker -- 1.0 packs/day for 25 years  . Smokeless tobacco: Not on file   Comment: 1-1.5 PPD  . Alcohol Use: Yes     couple glasses of wine several days of week    History reviewed. No pertinent family history. Prior to Admission medications   Medication Sig Start Date End Date Taking? Authorizing Provider  diazepam (VALIUM) 5 MG tablet Take 5 mg by mouth every 6 (six) hours as needed.   Yes Historical Provider, MD  escitalopram (LEXAPRO) 10 MG tablet Take 10 mg by mouth daily before breakfast.    Yes Historical Provider, MD  oxyCODONE-acetaminophen (PERCOCET) 10-325 MG per tablet Take 1 tablet by mouth every 4 (four) hours as needed. For pain   Yes Historical Provider, MD     Review  of Systems  Positive ROS: As above  All other systems have been reviewed and were otherwise negative with the exception of those mentioned in the HPI and as above.  Objective: Vital signs in last 24 hours: Temp:  [97.8 F (36.6 C)] 97.8 F (36.6 C) (09/11 0844) Pulse Rate:  [76] 76  (09/11 0844) Resp:  [18] 18  (09/11 0844) BP: (121)/(82) 121/82 mmHg (09/11 0844) SpO2:  [97 %] 97 % (09/11 0844)  General Appearance: Alert, cooperative, no distress, appears stated age Head: Normocephalic, without obvious abnormality, atraumatic Eyes: PERRL, conjunctiva/corneas clear, EOM's intact, fundi benign, both eyes      Ears: Normal TM's and external ear canals, both ears Throat: Lips, mucosa, and tongue normal; teeth and gums normal Neck: Supple, symmetrical, trachea midline, no adenopathy; thyroid: No enlargement/tenderness/nodules; no carotid bruit or JVD Back: Symmetric, no curvature, ROM normal, no CVA tenderness Lungs: Clear to auscultation bilaterally, respirations unlabored Heart: Regular rate and rhythm, S1 and S2 normal, no murmur, rub or gallop Abdomen: Soft, non-tender, bowel sounds active all four quadrants, no masses, no organomegaly Extremities: Extremities normal, atraumatic, no cyanosis or edema Pulses: 2+ and symmetric all extremities Skin: Skin color, texture, turgor normal, no rashes or lesions  NEUROLOGIC:   Mental status: alert and oriented, no aphasia, good attention span, Fund of  knowledge/ memory ok Motor Exam - grossly normal Sensory Exam - grossly normal Reflexes:  Coordination - grossly normal Gait - grossly normal Balance - grossly normal Cranial Nerves: I: smell Not tested  II: visual acuity  OS: Normal    OD: Normal   II: visual fields Full to confrontation  II: pupils Equal, round, reactive to light  III,VII: ptosis None  III,IV,VI: extraocular muscles  Full ROM  V: mastication Normal  V: facial light touch sensation  Normal  V,VII: corneal reflex   Present  VII: facial muscle function - upper  Normal  VII: facial muscle function - lower Normal  VIII: hearing Not tested  IX: soft palate elevation  Normal  IX,X: gag reflex Present  XI: trapezius strength  5/5  XI: sternocleidomastoid strength 5/5  XI: neck flexion strength  5/5  XII: tongue strength  Normal    Data Review Lab Results  Component Value Date   WBC 10.4 05/25/2012   HGB 14.0 05/25/2012   HCT 39.7 05/25/2012   MCV 98.8 05/25/2012   PLT 310 05/25/2012   Lab Results  Component Value Date   NA 139 05/25/2012   K 4.5 05/25/2012   CL 103 05/25/2012   CO2 27 05/25/2012   BUN 7 05/25/2012   CREATININE 0.66 05/25/2012   GLUCOSE 79 05/25/2012   No results found for this basename: INR, PROTIME    Assessment/Plan: C5-6 and C6-7 spondylosis, herniated disc, stenosis, cervical radiculopathy/myelopathy, cervicalgia: I discussed situation with the patient and her husband. I reviewed the MR scan with them and pointed out the abnormalities. We have discussed the various treatment options including surgery. I described the surgical option of a C5-6 and C6-7 anterior cervicectomy, fusion, and plating. I've shown him surgical models. I discussed the risks, benefits, alternatives and likelihood of achieving our goals with surgery. I have answered all the patient's questions. She wants to proceed with surgery.   Evangelyne Loja D 05/27/2012 10:13 AM

## 2012-05-27 NOTE — Op Note (Signed)
Brief history: The patient is a 41 year old white female who has suffered from neck and right arm pain consistent with a cervical radiculopathy. She has failed medical management and was worked up with a cervical MRI. This demonstrated a herniated disc at C6-7 and significant spondylosis at C5-6. I discussed the various treatment options with the patient including surgery. She has weighed the risks, benefits, and to surgery and decided proceed with a C5-6 and C6-7 anterior cervical discectomy, fusion, and plating.  Preoperative diagnosis: C5-6 spondylosis, C6-7 herniated disposes, cervical radiculopathy/myelopathy, cervicalgia, cervical spinal stenosis.  Postoperative diagnosis: Same  Procedure: C5-6 and C6-7 Anterior cervical discectomy/decompression; C5-6 and C6-7 interbody arthrodesis with local morcellized autograft bone and Actifuse bone graft extender; insertion of interbody prosthesis at C5-6 and C6-7 (Zimmer peek interbody prosthesis); anterior cervical plating from C5-C7 with globus titanium plate  Surgeon: Dr. Delma Officer  Asst.: Dr. Marikay Alar  Anesthesia: Gen. endotracheal  Estimated blood loss: 100 cc  Drains: None  Complications: None  Description of procedure: The patient was brought to the operating room by the anesthesia team. General endotracheal anesthesia was induced. A roll was placed under the patient's shoulders to keep the neck in the neutral position. The patient's anterior cervical region was then prepared with Betadine scrub and Betadine solution. Sterile drapes were applied.  The area to be incised was then injected with Marcaine with epinephrine solution. I then used a scalpel to make a transverse incision in the patient's left anterior neck. I used the Metzenbaum scissors to divide the platysmal muscle and then to dissect medial to the sternocleidomastoid muscle, jugular vein, and carotid artery. I carefully dissected down towards the anterior cervical spine  identifying the esophagus and retracting it medially. Then using Kitner swabs to clear soft tissue from the anterior cervical spine. We then inserted a bent spinal needle into the upper exposed intervertebral disc space. We then obtained intraoperative radiographs confirm our location.  I then used electrocautery to detach the medial border of the longus colli muscle bilaterally from the C5-6 and C6-7 intervertebral disc spaces. I then inserted the Caspar self-retaining retractor underneath the longus colli muscle bilaterally to provide exposure.  We then incised the intervertebral disc at C6-7. We then performed a partial intervertebral discectomy with a pituitary forceps and the Karlin curettes. I then inserted distraction screws into the vertebral bodies at C6 and C7. We then distracted the interspace. We then used the high-speed drill to decorticate the vertebral endplates at C6-7, to drill away the remainder of the intervertebral disc, to drill away some posterior spondylosis, and to thin out the posterior longitudinal ligament. I then incised ligament with the arachnoid knife. We then removed the ligament with a Kerrison punches undercutting the vertebral endplates and decompressing the thecal sac. We then performed foraminotomies about the bilateral C7 nerve roots. This completed the decompression at this level.  We then repeated this procedure in an analogous fashion at C5-6, decompressing the thecal sac and the bilateral C6 nerve roots.  We now turned our to attention to the interbody fusion. We used the trial spacers to determine the appropriate size for the interbody prosthesis. We then pre-filled prosthesis with a combination of local morcellized autograft bone that we obtained during decompression as well as Actifuse bone graft extender. We then inserted the prosthesis into the distracted interspace at C5-6 and C6-7. We then removed the distraction screws. There was a good snug fit of the  prosthesis in the interspace.   Having completed the  fusion we now turned attention to the anterior spinal instrumentation. We used the high-speed drill to drill away some anterior spondylosis at the disc spaces so that the plate lay down flat. We selected the appropriate length titanium anterior cervical plate. We laid it along the anterior aspect of the vertebral bodies from C5-C7. We then drilled 12 mm holes at C5, C6 and C7. We then secured the plate to the vertebral bodies by placing two 12 mm self-tapping screws at C5, C6 and C7. We then obtained intraoperative radiograph. The demonstrating good position of the instrumentation. We therefore secured the screws the plate the locking each cam. This completed the instrumentation.  We then obtained hemostasis using bipolar electrocautery. We irrigated the wound out with bacitracin solution. We then removed the retractor. We inspected the esophagus for any damage. There was none apparent. We then reapproximated patient's platysmal muscle with interrupted 3-0 Vicryl suture. We then reapproximated the subcutaneous tissue with interrupted 3-0 Vicryl suture. The skin was reapproximated with Steri-Strips and benzoin. The wound was then covered with bacitracin ointment. A sterile dressing was applied. The drapes were removed. Patient was subsequently extubated by the anesthesia team and transported to the post anesthesia care unit in stable condition. All sponge instrument and needle counts were reportedly correct at the end of this case.

## 2012-05-27 NOTE — Preoperative (Signed)
Beta Blockers   Reason not to administer Beta Blockers:Not Applicable 

## 2012-05-27 NOTE — Transfer of Care (Signed)
Immediate Anesthesia Transfer of Care Note  Patient: Sharon Chapman  Procedure(s) Performed: Procedure(s) (LRB) with comments: ANTERIOR CERVICAL DECOMPRESSION/DISCECTOMY FUSION 2 LEVELS (N/A) - Cervical five six, cervical six seven anterior cervical decompression with fusion interbody prothesis plating and bonegraft  Patient Location: PACU  Anesthesia Type: General  Level of Consciousness: awake, alert  and oriented  Airway & Oxygen Therapy: Patient Spontanous Breathing and Patient connected to nasal cannula oxygen  Post-op Assessment: Report given to PACU RN, Post -op Vital signs reviewed and stable and Patient moving all extremities X 4  Post vital signs: Reviewed and stable  Complications: No apparent anesthesia complications

## 2012-05-28 ENCOUNTER — Encounter (HOSPITAL_COMMUNITY): Payer: Self-pay | Admitting: Neurosurgery

## 2013-12-02 ENCOUNTER — Encounter (HOSPITAL_BASED_OUTPATIENT_CLINIC_OR_DEPARTMENT_OTHER): Payer: Self-pay | Admitting: *Deleted

## 2013-12-02 NOTE — Progress Notes (Signed)
No labs needed-recent cold-taking allegra and nasal spray-no fever

## 2013-12-03 ENCOUNTER — Other Ambulatory Visit: Payer: Self-pay | Admitting: Family Medicine

## 2013-12-03 ENCOUNTER — Other Ambulatory Visit: Payer: Self-pay | Admitting: Orthopedic Surgery

## 2013-12-03 DIAGNOSIS — N6310 Unspecified lump in the right breast, unspecified quadrant: Secondary | ICD-10-CM

## 2013-12-06 ENCOUNTER — Ambulatory Visit (HOSPITAL_BASED_OUTPATIENT_CLINIC_OR_DEPARTMENT_OTHER): Payer: 59 | Admitting: Anesthesiology

## 2013-12-06 ENCOUNTER — Encounter (HOSPITAL_BASED_OUTPATIENT_CLINIC_OR_DEPARTMENT_OTHER): Payer: Self-pay | Admitting: Anesthesiology

## 2013-12-06 ENCOUNTER — Encounter (HOSPITAL_BASED_OUTPATIENT_CLINIC_OR_DEPARTMENT_OTHER)
Admission: RE | Disposition: A | Discharge status: Home / Self Care | Payer: Self-pay | Source: Ambulatory Visit | Attending: Orthopedic Surgery

## 2013-12-06 ENCOUNTER — Encounter (HOSPITAL_BASED_OUTPATIENT_CLINIC_OR_DEPARTMENT_OTHER): Payer: 59 | Admitting: Anesthesiology

## 2013-12-06 ENCOUNTER — Ambulatory Visit (HOSPITAL_BASED_OUTPATIENT_CLINIC_OR_DEPARTMENT_OTHER)
Admission: RE | Admit: 2013-12-06 | Discharge: 2013-12-06 | Disposition: A | Discharge status: Home / Self Care | Payer: 59 | Source: Ambulatory Visit | Attending: Orthopedic Surgery | Admitting: Orthopedic Surgery

## 2013-12-06 DIAGNOSIS — G5600 Carpal tunnel syndrome, unspecified upper limb: Secondary | ICD-10-CM | POA: Insufficient documentation

## 2013-12-06 DIAGNOSIS — M65849 Other synovitis and tenosynovitis, unspecified hand: Secondary | ICD-10-CM

## 2013-12-06 DIAGNOSIS — M259 Joint disorder, unspecified: Secondary | ICD-10-CM | POA: Insufficient documentation

## 2013-12-06 DIAGNOSIS — M65839 Other synovitis and tenosynovitis, unspecified forearm: Secondary | ICD-10-CM | POA: Insufficient documentation

## 2013-12-06 DIAGNOSIS — F329 Major depressive disorder, single episode, unspecified: Secondary | ICD-10-CM | POA: Insufficient documentation

## 2013-12-06 DIAGNOSIS — F3289 Other specified depressive episodes: Secondary | ICD-10-CM | POA: Insufficient documentation

## 2013-12-06 DIAGNOSIS — F17200 Nicotine dependence, unspecified, uncomplicated: Secondary | ICD-10-CM | POA: Insufficient documentation

## 2013-12-06 HISTORY — PX: TRIGGER FINGER RELEASE: SHX641

## 2013-12-06 HISTORY — PX: EAR CYST EXCISION: SHX22

## 2013-12-06 HISTORY — DX: Other seasonal allergic rhinitis: J30.2

## 2013-12-06 HISTORY — PX: CARPAL TUNNEL RELEASE: SHX101

## 2013-12-06 LAB — POCT HEMOGLOBIN-HEMACUE: Hemoglobin: 14.4 g/dL (ref 12.0–15.0)

## 2013-12-06 SURGERY — RELEASE, A1 PULLEY, FOR TRIGGER FINGER
Anesthesia: General | Site: Wrist | Laterality: Left

## 2013-12-06 MED ORDER — LIDOCAINE HCL (CARDIAC) 20 MG/ML IV SOLN
INTRAVENOUS | Status: DC | PRN
  Administered 2013-12-06: 80 mg via INTRAVENOUS

## 2013-12-06 MED ORDER — MIDAZOLAM HCL 5 MG/5ML IJ SOLN
INTRAMUSCULAR | Status: DC | PRN
  Administered 2013-12-06: 2 mg via INTRAVENOUS

## 2013-12-06 MED ORDER — LIDOCAINE HCL (PF) 1 % IJ SOLN
INTRAMUSCULAR | Status: AC
  Filled 2013-12-06: qty 30

## 2013-12-06 MED ORDER — CHLORHEXIDINE GLUCONATE 4 % EX LIQD: 60.0000 mL | Freq: Once | CUTANEOUS | Status: DC

## 2013-12-06 MED ORDER — CEFAZOLIN SODIUM-DEXTROSE 2-3 GM-% IV SOLR
2.0000 g | INTRAVENOUS | Status: AC
  Administered 2013-12-06: 2 g via INTRAVENOUS

## 2013-12-06 MED ORDER — ONDANSETRON HCL 4 MG/2ML IJ SOLN: 4.0000 mg | Freq: Four times a day (QID) | INTRAMUSCULAR | Status: DC | PRN

## 2013-12-06 MED ORDER — LACTATED RINGERS IV SOLN
INTRAVENOUS | Status: DC
  Administered 2013-12-06: 08:00:00 via INTRAVENOUS

## 2013-12-06 MED ORDER — LIDOCAINE HCL 2 % IJ SOLN
INTRAMUSCULAR | Status: AC
  Filled 2013-12-06: qty 20

## 2013-12-06 MED ORDER — FENTANYL CITRATE 0.05 MG/ML IJ SOLN
INTRAMUSCULAR | Status: AC
  Filled 2013-12-06: qty 2

## 2013-12-06 MED ORDER — BUPIVACAINE HCL (PF) 0.25 % IJ SOLN
INTRAMUSCULAR | Status: DC | PRN
  Administered 2013-12-06: 20 mL

## 2013-12-06 MED ORDER — FENTANYL CITRATE 0.05 MG/ML IJ SOLN
INTRAMUSCULAR | Status: DC | PRN
  Administered 2013-12-06: 100 ug via INTRAVENOUS
  Administered 2013-12-06 (×2): 50 ug via INTRAVENOUS

## 2013-12-06 MED ORDER — CEFAZOLIN SODIUM-DEXTROSE 2-3 GM-% IV SOLR
INTRAVENOUS | Status: AC
  Filled 2013-12-06: qty 50

## 2013-12-06 MED ORDER — BUPIVACAINE HCL (PF) 0.25 % IJ SOLN
INTRAMUSCULAR | Status: AC
  Filled 2013-12-06: qty 30

## 2013-12-06 MED ORDER — FENTANYL CITRATE 0.05 MG/ML IJ SOLN
INTRAMUSCULAR | Status: AC
  Filled 2013-12-06: qty 4

## 2013-12-06 MED ORDER — OXYCODONE HCL 5 MG/5ML PO SOLN: 5.0000 mg | Freq: Once | ORAL | Status: DC | PRN

## 2013-12-06 MED ORDER — PROPOFOL 10 MG/ML IV BOLUS
INTRAVENOUS | Status: DC | PRN
  Administered 2013-12-06: 200 mg via INTRAVENOUS
  Administered 2013-12-06: 50 mg via INTRAVENOUS

## 2013-12-06 MED ORDER — OXYCODONE-ACETAMINOPHEN 5-325 MG PO TABS: 1.0000 | ORAL_TABLET | ORAL | Status: DC | PRN

## 2013-12-06 MED ORDER — FENTANYL CITRATE 0.05 MG/ML IJ SOLN
INTRAMUSCULAR | Status: AC
  Filled 2013-12-06: qty 6

## 2013-12-06 MED ORDER — MIDAZOLAM HCL 2 MG/2ML IJ SOLN
INTRAMUSCULAR | Status: AC
  Filled 2013-12-06: qty 2

## 2013-12-06 MED ORDER — BUPIVACAINE HCL (PF) 0.5 % IJ SOLN
INTRAMUSCULAR | Status: AC
  Filled 2013-12-06: qty 30

## 2013-12-06 MED ORDER — ONDANSETRON HCL 4 MG/2ML IJ SOLN
INTRAMUSCULAR | Status: DC | PRN
  Administered 2013-12-06: 4 mg via INTRAVENOUS

## 2013-12-06 MED ORDER — OXYCODONE HCL 5 MG PO TABS: 5.0000 mg | ORAL_TABLET | Freq: Once | ORAL | Status: DC | PRN

## 2013-12-06 MED ORDER — PROPOFOL 10 MG/ML IV BOLUS
INTRAVENOUS | Status: AC
  Filled 2013-12-06: qty 60

## 2013-12-06 MED ORDER — PROPOFOL 10 MG/ML IV BOLUS
INTRAVENOUS | Status: AC
  Filled 2013-12-06: qty 20

## 2013-12-06 MED ORDER — FENTANYL CITRATE 0.05 MG/ML IJ SOLN
25.0000 ug | INTRAMUSCULAR | Status: DC | PRN
  Administered 2013-12-06 (×2): 25 ug via INTRAVENOUS

## 2013-12-06 MED ORDER — DEXAMETHASONE SODIUM PHOSPHATE 10 MG/ML IJ SOLN
INTRAMUSCULAR | Status: DC | PRN
  Administered 2013-12-06: 10 mg via INTRAVENOUS

## 2013-12-06 SURGICAL SUPPLY — 56 items
APL SKNCLS STERI-STRIP NONHPOA (GAUZE/BANDAGES/DRESSINGS) ×2
BAG DECANTER FOR FLEXI CONT (MISCELLANEOUS) IMPLANT
BANDAGE ELASTIC 3 VELCRO ST LF (GAUZE/BANDAGES/DRESSINGS) ×4 IMPLANT
BANDAGE ELASTIC 4 VELCRO ST LF (GAUZE/BANDAGES/DRESSINGS) ×4 IMPLANT
BENZOIN TINCTURE PRP APPL 2/3 (GAUZE/BANDAGES/DRESSINGS) ×4 IMPLANT
BLADE SURG 15 STRL LF DISP TIS (BLADE) ×2 IMPLANT
BLADE SURG 15 STRL SS (BLADE) ×8
BNDG CMPR 9X4 STRL LF SNTH (GAUZE/BANDAGES/DRESSINGS) ×2
BNDG ESMARK 4X9 LF (GAUZE/BANDAGES/DRESSINGS) ×3 IMPLANT
BNDG GAUZE ELAST 4 BULKY (GAUZE/BANDAGES/DRESSINGS) ×4 IMPLANT
CLOSURE WOUND 1/2 X4 (GAUZE/BANDAGES/DRESSINGS) ×1
CORDS BIPOLAR (ELECTRODE) ×4 IMPLANT
COVER TABLE BACK 60X90 (DRAPES) ×4 IMPLANT
CUFF TOURNIQUET SINGLE 18IN (TOURNIQUET CUFF) ×3 IMPLANT
DECANTER SPIKE VIAL GLASS SM (MISCELLANEOUS) IMPLANT
DRAPE EXTREMITY T 121X128X90 (DRAPE) ×4 IMPLANT
DRAPE SURG 17X23 STRL (DRAPES) ×4 IMPLANT
DURAPREP 26ML APPLICATOR (WOUND CARE) ×4 IMPLANT
GAUZE XEROFORM 1X8 LF (GAUZE/BANDAGES/DRESSINGS) IMPLANT
GLOVE BIOGEL PI IND STRL 7.0 (GLOVE) IMPLANT
GLOVE BIOGEL PI INDICATOR 7.0 (GLOVE) ×2
GLOVE ECLIPSE 6.5 STRL STRAW (GLOVE) ×2 IMPLANT
GLOVE SURG SYN 8.0 (GLOVE) ×4 IMPLANT
GLOVE SURG SYN 8.0 PF PI (GLOVE) ×4 IMPLANT
GOWN STRL REUS W/ TWL LRG LVL3 (GOWN DISPOSABLE) ×2 IMPLANT
GOWN STRL REUS W/TWL LRG LVL3 (GOWN DISPOSABLE) ×4
GOWN STRL REUS W/TWL XL LVL3 (GOWN DISPOSABLE) ×4 IMPLANT
NDL HYPO 25X1 1.5 SAFETY (NEEDLE) IMPLANT
NEEDLE HYPO 25X1 1.5 SAFETY (NEEDLE) ×4 IMPLANT
NS IRRIG 1000ML POUR BTL (IV SOLUTION) ×4 IMPLANT
PACK BASIN DAY SURGERY FS (CUSTOM PROCEDURE TRAY) ×4 IMPLANT
PAD CAST 3X4 CTTN HI CHSV (CAST SUPPLIES) ×2 IMPLANT
PADDING CAST ABS 4INX4YD NS (CAST SUPPLIES)
PADDING CAST ABS COTTON 4X4 ST (CAST SUPPLIES) ×2 IMPLANT
PADDING CAST COTTON 3X4 STRL (CAST SUPPLIES) ×4
SHEET MEDIUM DRAPE 40X70 STRL (DRAPES) ×4 IMPLANT
SPLINT PLASTER CAST XFAST 3X15 (CAST SUPPLIES) IMPLANT
SPLINT PLASTER CAST XFAST 4X15 (CAST SUPPLIES) ×6 IMPLANT
SPLINT PLASTER XTRA FAST SET 4 (CAST SUPPLIES) ×2
SPLINT PLASTER XTRA FASTSET 3X (CAST SUPPLIES) ×30
SPONGE GAUZE 4X4 12PLY (GAUZE/BANDAGES/DRESSINGS) ×4 IMPLANT
SPONGE GAUZE 4X4 12PLY STER LF (GAUZE/BANDAGES/DRESSINGS) ×8 IMPLANT
STOCKINETTE 4X48 STRL (DRAPES) ×4 IMPLANT
STRIP CLOSURE SKIN 1/2X4 (GAUZE/BANDAGES/DRESSINGS) ×3 IMPLANT
SUT ETHILON 4 0 PS 2 18 (SUTURE) IMPLANT
SUT ETHILON 5 0 PS 2 18 (SUTURE) IMPLANT
SUT PROLENE 3 0 PS 2 (SUTURE) ×4 IMPLANT
SUT VIC AB 4-0 P-3 18XBRD (SUTURE) IMPLANT
SUT VIC AB 4-0 P3 18 (SUTURE)
SUT VICRYL RAPIDE 4-0 (SUTURE) IMPLANT
SUT VICRYL RAPIDE 4/0 PS 2 (SUTURE) IMPLANT
SYR BULB 3OZ (MISCELLANEOUS) ×4 IMPLANT
SYR CONTROL 10ML LL (SYRINGE) IMPLANT
SYRINGE 10CC LL (SYRINGE) ×2 IMPLANT
TOWEL OR 17X24 6PK STRL BLUE (TOWEL DISPOSABLE) ×4 IMPLANT
UNDERPAD 30X30 INCONTINENT (UNDERPADS AND DIAPERS) ×4 IMPLANT

## 2013-12-06 NOTE — Transfer of Care (Signed)
Immediate Anesthesia Transfer of Care Note  Patient: Sharon Chapman  Procedure(s) Performed: Procedure(s): LEFT CARPAL TUNNEL RELEASE AND LEFT THUMB A-1 PULLY RELEASE,LEFT WRIST DORSEL MASS EXCISION (Left) CARPAL TUNNEL RELEASE (Left) CYST REMOVAL (Left)  Patient Location: PACU  Anesthesia Type:General  Level of Consciousness: awake, alert  and oriented  Airway & Oxygen Therapy: Patient Spontanous Breathing and Patient connected to face mask oxygen  Post-op Assessment: Report given to PACU RN and Post -op Vital signs reviewed and stable  Post vital signs: Reviewed and stable  Complications: No apparent anesthesia complications

## 2013-12-06 NOTE — Op Note (Signed)
See note [phone]

## 2013-12-06 NOTE — H&P (Signed)
Sharon Chapman is an 43 y.o. female.   Chief Complaint: left thumb STS, CTS, and painful dorsal mass HPI: as above with positive NCV and chronic STS and dorsal mass  Past Medical History  Diagnosis Date  . Mental disorder   . Depression   . Anxiety     anxiety, ADD mixed   . Heart murmur     h/o suppsed heart murmur, seen cardiologist about 20 yrs. ago, no f/u since, sees [facility]  currently, no recenmt mention of heart murmur  . Arthritis     cervical myelopathy , spondylosis, recently reports low back pain   . Heart murmur     told many yrs. ago that she had a heart murmur, doesn't ever remember having any test related  to the finding   . Seasonal allergies     Past Surgical History  Procedure Laterality Date  . Breast enhancement surgery  2004  . Cesarean section      x2 , spinal , or epidural   . Anterior cervical decomp/discectomy fusion  05/27/2012    Procedure: ANTERIOR CERVICAL DECOMPRESSION/DISCECTOMY FUSION 2 LEVELS;  Surgeon: Ophelia Charter, MD;  Location: [facility];  Service: Neurosurgery;  Laterality: N/A;  Cervical five six, cervical six seven anterior cervical decompression with fusion interbody prothesis plating and bonegraft  . Knee arthroscopy      left    History reviewed. No pertinent family history. Social History:  reports that she has been smoking.  She does not have any smokeless tobacco history on file. She reports that she drinks alcohol. She reports that she does not use illicit drugs.  Allergies: No Known Allergies  Medications Prior to Admission  Medication Sig Dispense Refill  . Biotin 1 MG CAPS Take by mouth.      . escitalopram (LEXAPRO) 10 MG tablet Take 10 mg by mouth daily before breakfast.       . fexofenadine (ALLEGRA) 180 MG tablet Take 180 mg by mouth daily.      . fluticasone (FLONASE) 50 MCG/ACT nasal spray Place into both nostrils daily.      Marland Kitchen zolpidem (AMBIEN) 10 MG tablet Take 10 mg by mouth at bedtime as needed  for sleep.      Marland Kitchen oxyCODONE-acetaminophen (PERCOCET) 10-325 MG per tablet Take 1 tablet by mouth every 4 (four) hours as needed. For pain        No results found for this or any previous visit (from the past 48 hour(s)). No results found.  Review of Systems  All other systems reviewed and are negative.    Blood pressure 135/88, pulse 71, temperature 98.2 F (36.8 C), temperature source Oral, resp. rate 20, height 5\' 4"  (1.626 m), weight 63.957 kg (141 lb), last menstrual period 12/03/2013, SpO2 98.00%. Physical Exam  Constitutional: She is oriented to person, place, and time. She appears well-developed and well-nourished.  HENT:  Head: Normocephalic and atraumatic.  Cardiovascular: Normal rate.   Respiratory: Effort normal.  Musculoskeletal:       Left hand: She exhibits decreased range of motion and tenderness.  Chronic left thumb STS , CTS , and dorsal mass  Neurological: She is alert and oriented to person, place, and time.  Skin: Skin is warm.  Psychiatric: She has a normal mood and affect. Her behavior is normal. Judgment and thought content normal.     Assessment/Plan As above   Plan thumb STS release, CTR, and mass excicsion  Lauretta Sallas A 12/06/2013, 8:17 AM

## 2013-12-06 NOTE — Anesthesia Postprocedure Evaluation (Signed)
Anesthesia Post Note  Patient: [name]  Procedure(s) Performed: Procedure(s) (LRB): LEFT CARPAL TUNNEL RELEASE AND LEFT THUMB A-1 PULLY RELEASE,LEFT WRIST DORSEL MASS EXCISION (Left) CARPAL TUNNEL RELEASE (Left) CYST REMOVAL (Left)  Anesthesia type: General  Patient location: PACU  Post pain: Pain level controlled and Adequate analgesia  Post assessment: Post-op Vital signs reviewed, Patient's Cardiovascular Status Stable, Respiratory Function Stable, Patent Airway and Pain level controlled  Last Vitals:  Filed Vitals:   12/06/13 0930  BP: 144/89  Pulse: 70  Temp:   Resp: 18    Post vital signs: Reviewed and stable  Level of consciousness: awake, alert  and oriented  Complications: No apparent anesthesia complications

## 2013-12-06 NOTE — Op Note (Signed)
NAMEAUBRIEE, SZETO             ACCOUNT NO.:  [id number]  MEDICAL RECORD NO.:  [id number]  LOCATION:                                 FACILITY:  PHYSICIAN:  Sheral Apley. Tellis Spivak, M.D.DATE OF BIRTH:  Mar 10, 1971  DATE OF PROCEDURE:  12/06/2013 DATE OF DISCHARGE:  12/06/2013                              OPERATIVE REPORT   PREOPERATIVE DIAGNOSES:  Chronic left carpal tunnel syndrome, left thumb stenosing tenosynovitis, and painful left hand dorsal mass.  POSTOPERATIVE DIAGNOSES:  Chronic left carpal tunnel syndrome, left thumb stenosing tenosynovitis, and painful left hand dorsal mass.  PROCEDURE:  Left carpal tunnel release, left thumb A1 pulley release, synovectomy, and excision of left hand dorsal mass.  SURGEON:  Sheral Apley. Burney Gauze, M.D.  ASSISTANT:  None.  ANESTHESIA:  General.  TOURNIQUET TIME:  28 minutes.  COMPLICATIONS:  No complication.  DRAINS:  No drains.  One specimen sent.  DESCRIPTION OF PROCEDURE:  The patient was taken to the operating suite. After induction of adequate general anesthesia, left upper extremity was prepped and draped in usual sterile fashion.  An Esmarch was used to exsanguinate the limb.  Tourniquet was inflated to 250 mmHg.  At this point in time, a 2 cm incision was made in the palmar aspect of the left hand in line with long metacarpals starting at Kaplan's cardinal line. Skin was incised.  Palmar fascia was identified and split.  Distal edge of the transverse carpal ligament was identified, split with a 15-blade. The median nerve was identified, protected with a Soil scientist. Remaining aspects of the transverse carpal ligament were then divided under direct vision using curved blunt scissor.  The canal was inspected.  There were no osseous lesions or ganglions present.  It was irrigated, loosely closed with 3-0 Prolene subcuticular stitch.  Second incision was made transverse of the left thumb MP flexion crease.  Skin was incised  sharply.  Neurovascular bundle was identified and retracted. The A1 pulley was split.  The FPL tendon was lysed of all adhesions. Wound was irrigated again, loosely closed with 3-0 Prolene subcuticular stitch.  The hand was then fully pronated, placed over a padded towel and a transverse incision was made over the 1 x 2 cm dorsal mass.  Skin was incised transversely.  Dissection was carried down the interval between the 2nd and 4th dorsal compartments.  The 2nd dorsal compartment was retracted to the radial side, the 4th dorsal compartment to the ulnar side.  Small cystic lesion was seen coming off what appeared to be carpometacarpal boss over the index CMC joint.  The cyst was removed in its entirety.  We then used a rongeur to remove the large dorsal osteophyte at the carpometacarpal joint.  The wound was then thoroughly irrigated.  Hemostasis was achieved with bipolar cautery.  It was also loosely closed with 3-0 Prolene subcuticular stitch.  Steri-Strips, 4x4s, fluffs, and a volar splint was applied. The patient tolerated all procedures well, and went to the recovery room in stable fashion.     Sheral Apley Burney Gauze, M.D.   ______________________________ Sheral Apley. Burney Gauze, M.D.    MAW/MEDQ  D:  12/06/2013  T:  12/06/2013  Job:  [id number]

## 2013-12-06 NOTE — Anesthesia Preprocedure Evaluation (Addendum)
Anesthesia Evaluation  Patient identified by MRN, date of birth, ID band Patient awake    Reviewed: Allergy & Precautions, H&P , NPO status , Patient's Chart, lab work & pertinent test results  History of Anesthesia Complications Negative for: history of anesthetic complications  Airway Mallampati: II  Neck ROM: full    Dental   Pulmonary Current Smoker,          Cardiovascular negative cardio ROS      Neuro/Psych Anxiety Depression negative neurological ROS     GI/Hepatic   Endo/Other    Renal/GU      Musculoskeletal  (+) Arthritis -,   Abdominal   Peds  Hematology   Anesthesia Other Findings   Reproductive/Obstetrics                          Anesthesia Physical Anesthesia Plan  ASA: II  Anesthesia Plan: General   Post-op Pain Management:    Induction: Intravenous  Airway Management Planned: LMA  Additional Equipment:   Intra-op Plan:   Post-operative Plan:   Informed Consent: I have reviewed the patients History and Physical, chart, labs and discussed the procedure including the risks, benefits and alternatives for the proposed anesthesia with the patient or authorized representative who has indicated his/her understanding and acceptance.     Plan Discussed with: CRNA, Anesthesiologist and Surgeon  Anesthesia Plan Comments:         Anesthesia Quick Evaluation

## 2013-12-06 NOTE — Anesthesia Procedure Notes (Signed)
Procedure Name: LMA Insertion Date/Time: 12/06/2013 8:33 AM Performed by: Maryella Shivers Pre-anesthesia Checklist: Patient identified, Emergency Drugs available, Suction available and Patient being monitored Patient Re-evaluated:Patient Re-evaluated prior to inductionOxygen Delivery Method: Circle System Utilized Preoxygenation: Pre-oxygenation with 100% oxygen Intubation Type: IV induction Ventilation: Mask ventilation without difficulty LMA: LMA inserted LMA Size: 4.0 Number of attempts: 1 Airway Equipment and Method: bite block Placement Confirmation: positive ETCO2 Tube secured with: Tape Dental Injury: Teeth and Oropharynx as per pre-operative assessment

## 2013-12-06 NOTE — Discharge Instructions (Signed)

## 2013-12-07 ENCOUNTER — Encounter (HOSPITAL_BASED_OUTPATIENT_CLINIC_OR_DEPARTMENT_OTHER): Payer: Self-pay | Admitting: Orthopedic Surgery

## 2013-12-07 ENCOUNTER — Other Ambulatory Visit: Payer: Self-pay | Admitting: *Deleted

## 2013-12-07 DIAGNOSIS — I83893 Varicose veins of bilateral lower extremities with other complications: Secondary | ICD-10-CM

## 2013-12-13 ENCOUNTER — Other Ambulatory Visit: Payer: 59

## 2013-12-21 ENCOUNTER — Ambulatory Visit (INDEPENDENT_AMBULATORY_CARE_PROVIDER_SITE_OTHER): Payer: 59 | Admitting: Emergency Medicine

## 2013-12-21 VITALS — BP 130/80 | HR 105 | Temp 98.6°F | Resp 16 | Ht 63.0 in | Wt 143.0 lb

## 2013-12-21 DIAGNOSIS — M79609 Pain in unspecified limb: Secondary | ICD-10-CM

## 2013-12-21 DIAGNOSIS — L02419 Cutaneous abscess of limb, unspecified: Secondary | ICD-10-CM

## 2013-12-21 DIAGNOSIS — L03119 Cellulitis of unspecified part of limb: Principal | ICD-10-CM

## 2013-12-21 MED ORDER — CLINDAMYCIN HCL 300 MG PO CAPS: 300.0000 mg | ORAL_CAPSULE | Freq: Four times a day (QID) | ORAL | Status: DC

## 2013-12-21 MED ORDER — CEFTRIAXONE SODIUM 1 G IJ SOLR
250.0000 mg | Freq: Once | INTRAMUSCULAR | Status: AC
  Administered 2013-12-21: 250 mg via INTRAMUSCULAR

## 2013-12-21 MED ORDER — OXYCODONE-ACETAMINOPHEN 5-325 MG PO TABS: 1.0000 | ORAL_TABLET | Freq: Three times a day (TID) | ORAL | Status: DC | PRN

## 2013-12-21 NOTE — Progress Notes (Signed)
   Patient ID: VELNA HEDGECOCK MRN: [id number], DOB: 21-Sep-1970, 43 y.o. Date of Encounter: 12/21/2013, 8:45 PM    PROCEDURE NOTE: Verbal consent obtained. Risks and benefits of the procedure were explained to the patient. Patient made an informed decision to proceed with the procedure. Betadine prep per usual protocol. Local anesthesia obtained with 2% lidocaine with epi 2 cc.   1 cm incision made with 11 blade along lesion.  No culture taken. No purulence expressed. Minimal serosanguineous fluid expressed.    Lesion explored revealing no loculations. Irrigated with normal saline.  Packed with small amount of 1/4 inch plain packing.  Dressed. Wound care instructions including precautions with patient. Patient tolerated the procedure well. Recheck in 24 hours.      Signed, Christell Faith, MHS, PA-C [facility] [place], [place] [id number] [facility] 12/21/2013 8:45 PM

## 2013-12-21 NOTE — Patient Instructions (Signed)
Cellulitis Cellulitis is an infection of the skin and the tissue beneath it. The infected area is usually red and tender. Cellulitis occurs most often in the arms and lower legs.  CAUSES  Cellulitis is caused by bacteria that enter the skin through cracks or cuts in the skin. The most common types of bacteria that cause cellulitis are Staphylococcus and Streptococcus. SYMPTOMS   Redness and warmth.  Swelling.  Tenderness or pain.  Fever. DIAGNOSIS  Your caregiver can usually determine what is wrong based on a physical exam. Blood tests may also be done. TREATMENT  Treatment usually involves taking an antibiotic medicine. HOME CARE INSTRUCTIONS   Take your antibiotics as directed. Finish them even if you start to feel better.  Keep the infected arm or leg elevated to reduce swelling.  Apply a warm cloth to the affected area up to 4 times per day to relieve pain.  Only take over-the-counter or prescription medicines for pain, discomfort, or fever as directed by your caregiver.  Keep all follow-up appointments as directed by your caregiver. SEEK MEDICAL CARE IF:   You notice red streaks coming from the infected area.  Your red area gets larger or turns dark in color.  Your bone or joint underneath the infected area becomes painful after the skin has healed.  Your infection returns in the same area or another area.  You notice a swollen bump in the infected area.  You develop new symptoms. SEEK IMMEDIATE MEDICAL CARE IF:   You have a fever.  You feel very sleepy.  You develop vomiting or diarrhea.  You have a general ill feeling (malaise) with muscle aches and pains. MAKE SURE YOU:   Understand these instructions.  Will watch your condition.  Will get help right away if you are not doing well or get worse. Document Released: 06/12/2005 Document Revised: 03/03/2012 Document Reviewed: 11/18/2011 Exit[employer] Patient Information 2014 [employer].  

## 2013-12-21 NOTE — Progress Notes (Signed)
   Subjective:    Patient ID: Sharon Chapman, female    DOB: 04-Feb-1971, 43 y.o.   MRN: [id number]  HPI 43 yo female with 2-3 days of right knee pain.  Notes swelling on font of knee, just below knee cap.  Had small cut there for several weeks and noted pain and swelling over past two days.  Pain radiates down leg to anterior shin.  No fever or chills.  Pain not changed with joint ROM or ambulation.  PPMH:  Herniated disc  SH:  Smoker, positive alcohol   Review of Systems  Constitutional: Negative for fever and chills.  Musculoskeletal: Negative for arthralgias, back pain, gait problem and joint swelling.  Skin: Positive for color change, rash and wound.  Neurological: Negative for headaches.       Objective:   Physical Exam Blood pressure 130/80, pulse 105, temperature 98.6 F (37 C), temperature source Oral, resp. rate 16, height 5\' 3"  (1.6 m), weight 143 lb (64.864 kg), last menstrual period 12/03/2013, SpO2 98.00%. Body mass index is 25.34 kg/(m^2). Well-developed, well nourished female who is awake, alert and oriented, in NAD. HEENT: [place]/AT, PERRL, EOMI Neck: FROM Heart: RRR, no murmur Lungs: normal effort, CTA Extremities: Right knee:  Localized induration over patellar tendon region.  No joint effusion, FROM, No Patellar tenderness.  No ligamentous laxity.  Mild skin erythema from area of swelling to mid tibia. Skin: Warmth over induration, otherwise as above. Psychologic: good mood and appropriate affect, normal speech and behavior.  See procedure not by PA.     Assessment & Plan:  Cellulitis with induration over patellar tendon.  Will treat with one dose of Rocephin IM, Rx for Clindamycin as well as pain medication and follow up tomorrow for wound check.  Return or go to ED for any worsening symptoms.  Patient states she lost rx for Percocet from hand surgery and hasn't been taking.

## 2013-12-22 ENCOUNTER — Other Ambulatory Visit: Payer: Self-pay | Admitting: Physician Assistant

## 2013-12-22 ENCOUNTER — Other Ambulatory Visit (HOSPITAL_COMMUNITY)
Admission: RE | Admit: 2013-12-22 | Discharge: 2013-12-22 | Disposition: A | Discharge status: Home / Self Care | Payer: 59 | Source: Ambulatory Visit | Attending: Family Medicine | Admitting: Family Medicine

## 2013-12-22 DIAGNOSIS — Z0000 Encounter for general adult medical examination without abnormal findings: Secondary | ICD-10-CM | POA: Insufficient documentation

## 2013-12-24 ENCOUNTER — Encounter (INDEPENDENT_AMBULATORY_CARE_PROVIDER_SITE_OTHER): Payer: 59 | Admitting: General Surgery

## 2014-01-07 ENCOUNTER — Encounter (INDEPENDENT_AMBULATORY_CARE_PROVIDER_SITE_OTHER): Payer: Self-pay | Admitting: General Surgery

## 2014-01-07 ENCOUNTER — Ambulatory Visit (INDEPENDENT_AMBULATORY_CARE_PROVIDER_SITE_OTHER): Payer: 59 | Admitting: General Surgery

## 2014-01-07 ENCOUNTER — Telehealth (INDEPENDENT_AMBULATORY_CARE_PROVIDER_SITE_OTHER): Payer: Self-pay | Admitting: *Deleted

## 2014-01-07 VITALS — BP 142/80 | HR 80 | Temp 97.6°F | Resp 14

## 2014-01-07 DIAGNOSIS — N644 Mastodynia: Secondary | ICD-10-CM

## 2014-01-07 DIAGNOSIS — Z803 Family history of malignant neoplasm of breast: Secondary | ICD-10-CM

## 2014-01-07 NOTE — Telephone Encounter (Signed)
I have scheduled pt for MRI of Breast.  I contacted her insurance co and they have said based on the clinical information, that this request has not demonstrated consistency with evidence. She has had one recently in March.   A Peer to Peer is required by calling [phone] and choose option 3.  Case # [id number].Marland Kitchen  Please advise!  Anderson Malta

## 2014-01-07 NOTE — Telephone Encounter (Signed)
I have no information that the patient has ever had an MRI of the breast either from her history or reviewing the chart. Where it is the information coming from that she had one in March? If she has not had one, which I suspect is the case, can we please call them back and tell them this is incorrect? Do I have to do a peer to peer review because of incorrect information?  Please let me know. Dr. Lemmie Evens

## 2014-01-07 NOTE — Progress Notes (Signed)
Chief complaint: Right breast pain and possible mass  History: Patient is a 43 year old female referred by Sharon Chapman for evaluation for a possible breast mass. The patient has a significant family history of her mother being diagnosed with breast cancer in her late 57s. No other relatives apparently with breast cancer. The patient has a significant history of augmentation mammoplasty done a number of years ago with complications with some mammary implants and wound breakdown and then conversion to a subpectoral implants. She has had routine mammograms since an early age due to her family history. She recently was due routine imaging. About a month before this she developed an area of tenderness in the upper outer right breast and felt she was able to feel a subtle change or lump in the area. This was about a month before imaging. She recently presented to [facility]. Mammograms and ultrasound were unremarkable although she does have dense breast tissue and implants to make imaging a little more difficult. Subjectively the area is unchanged to the patient since she noticed. No other symptoms such as skin changes or nipple discharge.  Past Medical History  Diagnosis Date  . Mental disorder   . Depression   . Anxiety     anxiety, ADD mixed   . Heart murmur     h/o suppsed heart murmur, seen cardiologist about 20 yrs. ago, no f/u since, sees [facility]- Sharon Chapman  currently, no recenmt mention of heart murmur  . Arthritis     cervical myelopathy , spondylosis, recently reports low back pain   . Heart murmur     told many yrs. ago that she had a heart murmur, doesn't ever remember having any test related  to the finding   . Seasonal allergies    Past Surgical History  Procedure Laterality Date  . Breast enhancement surgery  2004  . Cesarean section      x2 , spinal , or epidural   . Anterior cervical decomp/discectomy fusion  05/27/2012    Procedure: ANTERIOR CERVICAL DECOMPRESSION/DISCECTOMY FUSION 2  LEVELS;  Surgeon: Ophelia Charter, MD;  Location: [place] [facility];  Service: Neurosurgery;  Laterality: N/A;  Cervical five six, cervical six seven anterior cervical decompression with fusion interbody prothesis plating and bonegraft  . Knee arthroscopy      left  . Trigger finger release Left 12/06/2013    Procedure: LEFT CARPAL TUNNEL RELEASE AND LEFT THUMB A-1 PULLY RELEASE,LEFT WRIST DORSEL MASS EXCISION;  Surgeon: Schuyler Amor, MD;  Location: [place];  Service: Orthopedics;  Laterality: Left;  . Carpal tunnel release Left 12/06/2013    Procedure: CARPAL TUNNEL RELEASE;  Surgeon: Schuyler Amor, MD;  Location: [place];  Service: Orthopedics;  Laterality: Left;  . Ear cyst excision Left 12/06/2013    Procedure: CYST REMOVAL;  Surgeon: Schuyler Amor, MD;  Location: [place];  Service: Orthopedics;  Laterality: Left;   Current Outpatient Prescriptions  Medication Sig Dispense Refill  . Biotin 1 MG CAPS Take by mouth.      . escitalopram (LEXAPRO) 10 MG tablet Take 10 mg by mouth daily before breakfast.       . zolpidem (AMBIEN) 10 MG tablet Take 10 mg by mouth at bedtime as needed for sleep.       No current facility-administered medications for this visit.   No Known Allergies  Exam: 43 year old patient with family history of premenopausal breast cancer in her mother, status post mammoplasty with complications  and revisional surgery. She has a subtle possible mass and discomfort in the upper outer right breast. Imaging was unremarkable but limited due to dense breast tissue. With her family history and difficulties with imaging I recommended proceeding with MRI. If this is negative I think she could be followed closely. Discussed with the patient and her husband who agree. I gave her appointment for 4 months but will call her with results of the MRI. BP 142/80  Pulse 80  Temp(Src) 97.6 F (36.4 C)  Resp 14 General:  Healthy-appearing Caucasian female Breasts: Healed incisions bilaterally. With the patient standing I am able to feel a subtle slightly tender ridge of tissue in the axillary tail which is what the patient is feeling. This does stand out slightly from surrounding tissue. I cannot feel any other abnormalities in either breast. No skin changes or nipple inversion. No palpable axillary adenopathy.  Assessment and plan:

## 2014-01-11 ENCOUNTER — Ambulatory Visit
Admission: RE | Admit: 2014-01-11 | Discharge: 2014-01-11 | Disposition: A | Discharge status: Home / Self Care | Payer: 59 | Source: Ambulatory Visit | Attending: General Surgery | Admitting: General Surgery

## 2014-01-11 DIAGNOSIS — N644 Mastodynia: Secondary | ICD-10-CM

## 2014-01-11 DIAGNOSIS — Z803 Family history of malignant neoplasm of breast: Secondary | ICD-10-CM

## 2014-01-11 MED ORDER — GADOBENATE DIMEGLUMINE 529 MG/ML IV SOLN
13.0000 mL | Freq: Once | INTRAVENOUS | Status: AC | PRN
  Administered 2014-01-11: 13 mL via INTRAVENOUS

## 2014-01-18 ENCOUNTER — Telehealth (INDEPENDENT_AMBULATORY_CARE_PROVIDER_SITE_OTHER): Payer: Self-pay | Admitting: General Surgery

## 2014-01-18 NOTE — Telephone Encounter (Signed)
Called re MRI, no answer, left message

## 2014-02-14 ENCOUNTER — Encounter: Payer: Self-pay | Admitting: Vascular Surgery

## 2014-02-15 ENCOUNTER — Encounter: Payer: 59 | Admitting: Vascular Surgery

## 2014-02-15 ENCOUNTER — Inpatient Hospital Stay (HOSPITAL_COMMUNITY): Admission: RE | Admit: 2014-02-15 | Payer: 59 | Source: Ambulatory Visit

## 2014-04-12 ENCOUNTER — Encounter: Payer: Self-pay | Admitting: Vascular Surgery

## 2014-04-13 ENCOUNTER — Encounter (INDEPENDENT_AMBULATORY_CARE_PROVIDER_SITE_OTHER): Payer: Self-pay | Admitting: General Surgery

## 2014-04-13 ENCOUNTER — Inpatient Hospital Stay (HOSPITAL_COMMUNITY): Admission: RE | Admit: 2014-04-13 | Payer: 59 | Source: Ambulatory Visit

## 2014-04-13 ENCOUNTER — Encounter: Payer: 59 | Admitting: Vascular Surgery

## 2014-07-06 ENCOUNTER — Other Ambulatory Visit: Payer: Self-pay | Admitting: *Deleted

## 2014-07-06 ENCOUNTER — Ambulatory Visit
Admission: RE | Admit: 2014-07-06 | Discharge: 2014-07-06 | Disposition: A | Discharge status: Home / Self Care | Payer: 59 | Source: Ambulatory Visit | Attending: *Deleted | Admitting: *Deleted

## 2014-07-06 DIAGNOSIS — R05 Cough: Secondary | ICD-10-CM

## 2014-07-06 DIAGNOSIS — R053 Chronic cough: Secondary | ICD-10-CM

## 2014-11-04 ENCOUNTER — Encounter: Payer: Self-pay | Admitting: Surgery

## 2014-11-07 ENCOUNTER — Ambulatory Visit (INDEPENDENT_AMBULATORY_CARE_PROVIDER_SITE_OTHER): Payer: 59 | Admitting: Surgery

## 2014-11-07 ENCOUNTER — Ambulatory Visit (HOSPITAL_COMMUNITY)
Admission: RE | Admit: 2014-11-07 | Discharge: 2014-11-07 | Disposition: A | Discharge status: Home / Self Care | Payer: 59 | Source: Ambulatory Visit | Attending: Surgery | Admitting: Surgery

## 2014-11-07 DIAGNOSIS — I8393 Asymptomatic varicose veins of bilateral lower extremities: Secondary | ICD-10-CM

## 2014-11-07 DIAGNOSIS — I739 Peripheral vascular disease, unspecified: Secondary | ICD-10-CM

## 2014-11-07 NOTE — Progress Notes (Signed)
Ms. Lakatos R/S appointment after lab studies were complete unable to stay due to daughter being in labor. No vitals or history done. Rebbie Lauricella Maness-Harrison CMA, AAMA

## 2014-11-10 ENCOUNTER — Encounter: Payer: Self-pay | Admitting: Vascular Surgery

## 2014-11-11 ENCOUNTER — Encounter: Payer: 59 | Admitting: Vascular Surgery

## 2015-04-18 ENCOUNTER — Other Ambulatory Visit: Payer: Self-pay | Admitting: Radiology

## 2015-05-01 ENCOUNTER — Other Ambulatory Visit: Payer: Self-pay | Admitting: Neurosurgery

## 2015-05-01 DIAGNOSIS — G8929 Other chronic pain: Secondary | ICD-10-CM

## 2015-05-01 DIAGNOSIS — M542 Cervicalgia: Principal | ICD-10-CM

## 2015-05-09 ENCOUNTER — Ambulatory Visit
Admission: RE | Admit: 2015-05-09 | Discharge: 2015-05-09 | Disposition: A | Discharge status: Home / Self Care | Payer: PRIVATE HEALTH INSURANCE | Source: Ambulatory Visit | Attending: Neurosurgery | Admitting: Neurosurgery

## 2015-05-09 VITALS — BP 112/64 | HR 59

## 2015-05-09 DIAGNOSIS — G8929 Other chronic pain: Secondary | ICD-10-CM

## 2015-05-09 DIAGNOSIS — M542 Cervicalgia: Principal | ICD-10-CM

## 2015-05-09 DIAGNOSIS — M5020 Other cervical disc displacement, unspecified cervical region: Secondary | ICD-10-CM

## 2015-05-09 MED ORDER — MEPERIDINE HCL 100 MG/ML IJ SOLN
75.0000 mg | Freq: Once | INTRAMUSCULAR | Status: AC
  Administered 2015-05-09: 75 mg via INTRAMUSCULAR

## 2015-05-09 MED ORDER — ONDANSETRON HCL 4 MG/2ML IJ SOLN
4.0000 mg | Freq: Once | INTRAMUSCULAR | Status: AC
  Administered 2015-05-09: 4 mg via INTRAMUSCULAR

## 2015-05-09 MED ORDER — DIAZEPAM 5 MG PO TABS
10.0000 mg | ORAL_TABLET | Freq: Once | ORAL | Status: AC
  Administered 2015-05-09: 10 mg via ORAL

## 2015-05-09 MED ORDER — IOHEXOL 300 MG/ML  SOLN
10.0000 mL | Freq: Once | INTRAMUSCULAR | Status: DC | PRN
  Administered 2015-05-09: 10 mL via INTRATHECAL

## 2015-05-09 NOTE — Discharge Instructions (Signed)
Myelogram Discharge Instructions  1. Go home and rest quietly for the next 24 hours.  It is important to lie flat for the next 24 hours.  Get up only to go to the restroom.  You may lie in the bed or on a couch on your back, your stomach, your left side or your right side.  You may have one pillow under your head.  You may have pillows between your knees while you are on your side or under your knees while you are on your back.  2. DO NOT drive today.  Recline the seat as far back as it will go, while still wearing your seat belt, on the way home.  3. You may get up to go to the bathroom as needed.  You may sit up for 10 minutes to eat.  You may resume your normal diet and medications unless otherwise indicated.  Drink lots of extra fluids today and tomorrow.  4. The incidence of headache, nausea, or vomiting is about 5% (one in 20 patients).  If you develop a headache, lie flat and drink plenty of fluids until the headache goes away.  Caffeinated beverages may be helpful.  If you develop severe nausea and vomiting or a headache that does not go away with flat bed rest, call [phone].  5. You may resume normal activities after your 24 hours of bed rest is over; however, do not exert yourself strongly or do any heavy lifting tomorrow. If when you get up you have a headache when standing, go back to bed and force fluids for another 24 hours.  6. Call your physician for a follow-up appointment.  The results of your myelogram will be sent directly to your physician by the following day.  7. If you have any questions or if complications develop after you arrive home, please call [phone].  Discharge instructions have been explained to the patient.  The patient, or the person responsible for the patient, fully understands these instructions.       May resume Adderall on Aug. 24, 2016, after 9:30 am.

## 2015-05-09 NOTE — Progress Notes (Signed)
Pt states she has been off Adderall for the past 2 days.

## 2015-12-07 ENCOUNTER — Other Ambulatory Visit: Payer: Self-pay | Admitting: Otolaryngology

## 2015-12-07 DIAGNOSIS — R0981 Nasal congestion: Secondary | ICD-10-CM

## 2015-12-07 DIAGNOSIS — R51 Headache: Principal | ICD-10-CM

## 2015-12-07 DIAGNOSIS — R519 Headache, unspecified: Secondary | ICD-10-CM

## 2015-12-13 ENCOUNTER — Ambulatory Visit
Admission: RE | Admit: 2015-12-13 | Discharge: 2015-12-13 | Disposition: A | Discharge status: Home / Self Care | Payer: PRIVATE HEALTH INSURANCE | Source: Ambulatory Visit | Attending: Otolaryngology | Admitting: Otolaryngology

## 2015-12-13 DIAGNOSIS — R51 Headache: Principal | ICD-10-CM

## 2015-12-13 DIAGNOSIS — R519 Headache, unspecified: Secondary | ICD-10-CM

## 2015-12-13 DIAGNOSIS — R0981 Nasal congestion: Secondary | ICD-10-CM

## 2016-01-15 ENCOUNTER — Encounter (HOSPITAL_BASED_OUTPATIENT_CLINIC_OR_DEPARTMENT_OTHER): Payer: Self-pay | Admitting: *Deleted

## 2016-01-18 ENCOUNTER — Other Ambulatory Visit: Payer: Self-pay | Admitting: Otolaryngology

## 2016-01-18 NOTE — H&P (Signed)
PREOPERATIVE H&P  Chief Complaint: chronic sinus infection  HPI: Sharon Chapman is a 45 y.o. female who presents for evaluation of chronic sinus infection. She's been on several rounds of antibiotics. Recent CT scan demonstrated complete opacification of the left maxillary sinus, ethmoid and frontal sinus. The right side appears much clearer. She's taken to the OR for FESS and TRs.  Past Medical History  Diagnosis Date  . Mental disorder   . Depression   . Anxiety     anxiety, ADD mixed   . Heart murmur     h/o suppsed heart murmur, seen cardiologist about 20 yrs. ago, no f/u since, sees [facility]- [facility]  currently, no recenmt mention of heart murmur  . Arthritis     cervical myelopathy , spondylosis, recently reports low back pain   . Heart murmur     told many yrs. ago that she had a heart murmur, doesn't ever remember having any test related  to the finding   . Seasonal allergies    Past Surgical History  Procedure Laterality Date  . Breast enhancement surgery  2004  . Cesarean section      x2 , spinal , or epidural   . Anterior cervical decomp/discectomy fusion  05/27/2012    Procedure: ANTERIOR CERVICAL DECOMPRESSION/DISCECTOMY FUSION 2 LEVELS;  Surgeon: Ophelia Charter, MD;  Location: [place] [facility];  Service: Neurosurgery;  Laterality: N/A;  Cervical five six, cervical six seven anterior cervical decompression with fusion interbody prothesis plating and bonegraft  . Knee arthroscopy      left  . Trigger finger release Left 12/06/2013    Procedure: LEFT CARPAL TUNNEL RELEASE AND LEFT THUMB A-1 PULLY RELEASE,LEFT WRIST DORSEL MASS EXCISION;  Surgeon: Schuyler Amor, MD;  Location: [place];  Service: Orthopedics;  Laterality: Left;  . Carpal tunnel release Left 12/06/2013    Procedure: CARPAL TUNNEL RELEASE;  Surgeon: Schuyler Amor, MD;  Location: [place];  Service: Orthopedics;  Laterality: Left;  . Ear cyst excision Left  12/06/2013    Procedure: CYST REMOVAL;  Surgeon: Schuyler Amor, MD;  Location: [place];  Service: Orthopedics;  Laterality: Left;   Social History   Social History  . Marital Status: Married    Spouse Name: N/A  . Number of Children: N/A  . Years of Education: N/A   Social History Main Topics  . Smoking status: Current Every Day Smoker -- 1.00 packs/day for 25 years    Types: Cigarettes  . Smokeless tobacco: None     Comment: 1-1.5 PPD  . Alcohol Use: Yes     Comment: couple glasses of wine several days of week  . Drug Use: No  . Sexual Activity: Not Asked   Other Topics Concern  . None   Social History Narrative   History reviewed. No pertinent family history. No Known Allergies Prior to Admission medications   Medication Sig Start Date End Date Taking? Authorizing Provider  amphetamine-dextroamphetamine (ADDERALL) 30 MG tablet Take 30 mg by mouth.   Yes Historical Provider, MD  zolpidem (AMBIEN) 10 MG tablet Take 10 mg by mouth at bedtime as needed for sleep.   Yes Historical Provider, MD     Positive ROS: chronic coughing and nasal congestion  All other systems have been reviewed and were otherwise negative with the exception of those mentioned in the HPI and as above.  Physical Exam: There were no vitals filed for this visit.  General: Alert,  no acute distress Oral: Normal oral mucosa and tonsils Nasal: Clear nasal passages, no polyps. Purulent discharge from the left middle meatus Neck: No palpable adenopathy or thyroid nodules Ear: Ear canal is clear with normal appearing TMs, right MT Cardiovascular: Regular rate and rhythm, no murmur.  Respiratory: Clear to auscultation Neurologic: Alert and oriented x 3   Assessment/Plan: chronic sinusitis Plan for Procedure(s): BILATERAL ETHMOIDECTOMY, AND BILATERAL MAXILLARY ANTROSTOMY WITH FUSION TURBINATE REDUCTIONS FUSION   Melony Overly, MD 01/18/2016 4:59 PM

## 2016-01-19 ENCOUNTER — Ambulatory Visit (HOSPITAL_BASED_OUTPATIENT_CLINIC_OR_DEPARTMENT_OTHER): Payer: Commercial Managed Care - HMO | Admitting: Anesthesiology

## 2016-01-19 ENCOUNTER — Encounter (HOSPITAL_BASED_OUTPATIENT_CLINIC_OR_DEPARTMENT_OTHER): Payer: Self-pay | Admitting: *Deleted

## 2016-01-19 ENCOUNTER — Ambulatory Visit (HOSPITAL_BASED_OUTPATIENT_CLINIC_OR_DEPARTMENT_OTHER)
Admission: RE | Admit: 2016-01-19 | Discharge: 2016-01-19 | Disposition: A | Discharge status: Home / Self Care | Payer: Commercial Managed Care - HMO | Source: Ambulatory Visit | Attending: Otolaryngology | Admitting: Otolaryngology

## 2016-01-19 ENCOUNTER — Encounter (HOSPITAL_BASED_OUTPATIENT_CLINIC_OR_DEPARTMENT_OTHER)
Admission: RE | Disposition: A | Discharge status: Home / Self Care | Payer: Self-pay | Source: Ambulatory Visit | Attending: Otolaryngology

## 2016-01-19 DIAGNOSIS — J329 Chronic sinusitis, unspecified: Secondary | ICD-10-CM | POA: Insufficient documentation

## 2016-01-19 DIAGNOSIS — F17210 Nicotine dependence, cigarettes, uncomplicated: Secondary | ICD-10-CM | POA: Diagnosis not present

## 2016-01-19 HISTORY — PX: SINUS ENDO WITH FUSION: SHX5329

## 2016-01-19 HISTORY — PX: TURBINATE REDUCTION: SHX6157

## 2016-01-19 SURGERY — SURGERY, PARANASAL SINUS, ENDOSCOPIC, WITH NASAL SEPTOPLASTY, TURBINOPLASTY, AND MAXILLARY SINUSOTOMY
Anesthesia: General | Site: Nose | Laterality: Bilateral

## 2016-01-19 MED ORDER — OXYCODONE HCL 5 MG PO TABS
ORAL_TABLET | ORAL | Status: AC
  Filled 2016-01-19: qty 1

## 2016-01-19 MED ORDER — OXYMETAZOLINE HCL 0.05 % NA SOLN
NASAL | Status: DC | PRN
  Administered 2016-01-19: 1 via TOPICAL

## 2016-01-19 MED ORDER — DEXAMETHASONE SODIUM PHOSPHATE 4 MG/ML IJ SOLN
INTRAMUSCULAR | Status: DC | PRN
Start: 2016-01-19 — End: 2016-01-19
  Administered 2016-01-19: 10 mg via INTRAVENOUS

## 2016-01-19 MED ORDER — LACTATED RINGERS IV SOLN
INTRAVENOUS | Status: DC
  Administered 2016-01-19 (×2): via INTRAVENOUS

## 2016-01-19 MED ORDER — HYDROCODONE-ACETAMINOPHEN 5-325 MG PO TABS: 1.0000 | ORAL_TABLET | Freq: Four times a day (QID) | ORAL | Status: DC | PRN

## 2016-01-19 MED ORDER — MUPIROCIN 2 % EX OINT
TOPICAL_OINTMENT | CUTANEOUS | Status: DC | PRN
  Administered 2016-01-19: 1 via NASAL

## 2016-01-19 MED ORDER — DEXAMETHASONE SODIUM PHOSPHATE 10 MG/ML IJ SOLN
INTRAMUSCULAR | Status: AC
  Filled 2016-01-19: qty 1

## 2016-01-19 MED ORDER — CEFAZOLIN SODIUM-DEXTROSE 2-4 GM/100ML-% IV SOLN
2.0000 g | INTRAVENOUS | Status: AC
  Administered 2016-01-19: 2 g via INTRAVENOUS

## 2016-01-19 MED ORDER — LIDOCAINE HCL (CARDIAC) 20 MG/ML IV SOLN
INTRAVENOUS | Status: DC | PRN
  Administered 2016-01-19: 80 mg via INTRAVENOUS

## 2016-01-19 MED ORDER — SUCCINYLCHOLINE CHLORIDE 200 MG/10ML IV SOSY
PREFILLED_SYRINGE | INTRAVENOUS | Status: AC
  Filled 2016-01-19: qty 10

## 2016-01-19 MED ORDER — MIDAZOLAM HCL 2 MG/2ML IJ SOLN
INTRAMUSCULAR | Status: AC
  Filled 2016-01-19: qty 2

## 2016-01-19 MED ORDER — METHYLPREDNISOLONE ACETATE 80 MG/ML IJ SUSP
INTRAMUSCULAR | Status: DC | PRN
  Administered 2016-01-19: 80 mg

## 2016-01-19 MED ORDER — SUCCINYLCHOLINE CHLORIDE 20 MG/ML IJ SOLN
INTRAMUSCULAR | Status: DC | PRN
  Administered 2016-01-19: 100 mg via INTRAVENOUS

## 2016-01-19 MED ORDER — OXYCODONE HCL 5 MG PO TABS
5.0000 mg | ORAL_TABLET | Freq: Once | ORAL | Status: AC | PRN
  Administered 2016-01-19: 5 mg via ORAL

## 2016-01-19 MED ORDER — LIDOCAINE-EPINEPHRINE 1 %-1:100000 IJ SOLN
INTRAMUSCULAR | Status: DC | PRN
  Administered 2016-01-19: 3.3 mL
  Administered 2016-01-19: 12.5 mL

## 2016-01-19 MED ORDER — OXYMETAZOLINE HCL 0.05 % NA SOLN
NASAL | Status: AC
  Filled 2016-01-19: qty 15

## 2016-01-19 MED ORDER — PROPOFOL 10 MG/ML IV BOLUS
INTRAVENOUS | Status: DC | PRN
  Administered 2016-01-19: 200 mg via INTRAVENOUS

## 2016-01-19 MED ORDER — FENTANYL CITRATE (PF) 100 MCG/2ML IJ SOLN
50.0000 ug | INTRAMUSCULAR | Status: AC | PRN
  Administered 2016-01-19: 100 ug via INTRAVENOUS
  Administered 2016-01-19 (×4): 25 ug via INTRAVENOUS

## 2016-01-19 MED ORDER — MIDAZOLAM HCL 2 MG/2ML IJ SOLN
1.0000 mg | INTRAMUSCULAR | Status: DC | PRN
  Administered 2016-01-19: 2 mg via INTRAVENOUS

## 2016-01-19 MED ORDER — PROPOFOL 10 MG/ML IV BOLUS
INTRAVENOUS | Status: AC
  Filled 2016-01-19: qty 20

## 2016-01-19 MED ORDER — OXYCODONE HCL 5 MG/5ML PO SOLN: 5.0000 mg | Freq: Once | ORAL | Status: AC | PRN

## 2016-01-19 MED ORDER — FENTANYL CITRATE (PF) 100 MCG/2ML IJ SOLN
INTRAMUSCULAR | Status: AC
  Filled 2016-01-19: qty 2

## 2016-01-19 MED ORDER — MEPERIDINE HCL 25 MG/ML IJ SOLN: 6.2500 mg | INTRAMUSCULAR | Status: DC | PRN

## 2016-01-19 MED ORDER — SODIUM CHLORIDE 0.9 % IV SOLN
INTRAVENOUS | Status: DC | PRN
  Administered 2016-01-19: 6.7 mL via INTRAMUSCULAR

## 2016-01-19 MED ORDER — LIDOCAINE 2% (20 MG/ML) 5 ML SYRINGE
INTRAMUSCULAR | Status: AC
  Filled 2016-01-19: qty 5

## 2016-01-19 MED ORDER — ARTIFICIAL TEARS OP OINT
TOPICAL_OINTMENT | OPHTHALMIC | Status: AC
  Filled 2016-01-19: qty 3.5

## 2016-01-19 MED ORDER — ONDANSETRON HCL 4 MG/2ML IJ SOLN
INTRAMUSCULAR | Status: DC | PRN
  Administered 2016-01-19: 4 mg via INTRAVENOUS

## 2016-01-19 MED ORDER — PROPOFOL 10 MG/ML IV BOLUS
INTRAVENOUS | Status: AC
Start: 2016-01-19 — End: 2016-01-19
  Filled 2016-01-19: qty 20

## 2016-01-19 MED ORDER — SCOPOLAMINE 1 MG/3DAYS TD PT72: 1.0000 | MEDICATED_PATCH | Freq: Once | TRANSDERMAL | Status: DC | PRN

## 2016-01-19 MED ORDER — CEFAZOLIN SODIUM-DEXTROSE 2-4 GM/100ML-% IV SOLN
INTRAVENOUS | Status: AC
  Filled 2016-01-19: qty 100

## 2016-01-19 MED ORDER — EPINEPHRINE HCL 1 MG/ML IJ SOLN
INTRAMUSCULAR | Status: AC
  Filled 2016-01-19: qty 1

## 2016-01-19 MED ORDER — GLYCOPYRROLATE 0.2 MG/ML IJ SOLN: 0.2000 mg | Freq: Once | INTRAMUSCULAR | Status: DC | PRN

## 2016-01-19 MED ORDER — ONDANSETRON HCL 4 MG/2ML IJ SOLN
INTRAMUSCULAR | Status: AC
  Filled 2016-01-19: qty 2

## 2016-01-19 MED ORDER — LIDOCAINE-EPINEPHRINE 1 %-1:100000 IJ SOLN
INTRAMUSCULAR | Status: AC
  Filled 2016-01-19: qty 1

## 2016-01-19 MED ORDER — CEPHALEXIN 500 MG PO CAPS: 500.0000 mg | ORAL_CAPSULE | Freq: Two times a day (BID) | ORAL | Status: DC

## 2016-01-19 MED ORDER — SODIUM CHLORIDE 0.9 % IV SOLN
INTRAVENOUS | Status: DC | PRN
  Administered 2016-01-19: 550 mL

## 2016-01-19 MED ORDER — LIDOCAINE HCL (CARDIAC) 20 MG/ML IV SOLN: INTRAVENOUS | Status: DC | PRN

## 2016-01-19 MED ORDER — HYDROMORPHONE HCL 1 MG/ML IJ SOLN: 0.2500 mg | INTRAMUSCULAR | Status: DC | PRN

## 2016-01-19 MED ORDER — MUPIROCIN 2 % EX OINT
TOPICAL_OINTMENT | CUTANEOUS | Status: AC
  Filled 2016-01-19: qty 22

## 2016-01-19 SURGICAL SUPPLY — 70 items
ATTRACTOMAT 16X20 MAGNETIC DRP (DRAPES) ×1 IMPLANT
BALLN SINUPLASTY KIT 6X16 (BALLOONS)
BALLOON SINUPLASTY KIT 6X16 (BALLOONS) IMPLANT
BLADE INF TURB ROT M4 2 5PK (BLADE) ×2 IMPLANT
BLADE RAD40 ROTATE 4M 4 5PK (BLADE) IMPLANT
BLADE RAD60 ROTATE M4 4 5PK (BLADE) IMPLANT
BLADE ROTATE RAD 12 4 M4 (BLADE) IMPLANT
BLADE ROTATE RAD 40 4 M4 (BLADE) ×2 IMPLANT
BLADE ROTATE TRICUT 4X13 M4 (BLADE) ×3 IMPLANT
BLADE SURG 15 STRL LF DISP TIS (BLADE) ×2 IMPLANT
BLADE SURG 15 STRL SS (BLADE) ×3
BLADE TRICUT ROTATE M4 4 5PK (BLADE) IMPLANT
BUR HS RAD FRONTAL 3 (BURR) IMPLANT
CANISTER SUC SOCK COL 7IN (MISCELLANEOUS) ×4 IMPLANT
CANISTER SUCT 1200ML W/VALVE (MISCELLANEOUS) ×3 IMPLANT
CLEANER CAUTERY TIP 5X5 PAD (MISCELLANEOUS) IMPLANT
COAGULATOR SUCT 8FR VV (MISCELLANEOUS) ×2 IMPLANT
COVER PROBE W GEL 5X96 (DRAPES) IMPLANT
DECANTER SPIKE VIAL GLASS SM (MISCELLANEOUS) IMPLANT
DEVICE INFLATION SEID (MISCELLANEOUS) IMPLANT
DRAPE SURG 17X23 STRL (DRAPES) ×1 IMPLANT
DRESSING NASAL KENNEDY 3.5X.9 (MISCELLANEOUS) IMPLANT
DRSG NASAL KENNEDY 3.5X.9 (MISCELLANEOUS)
DRSG NASAL KENNEDY LMNT 8CM (GAUZE/BANDAGES/DRESSINGS) IMPLANT
DRSG NASOPORE 8CM (GAUZE/BANDAGES/DRESSINGS) ×2 IMPLANT
DRSG TELFA 3X8 NADH (GAUZE/BANDAGES/DRESSINGS) IMPLANT
ELECT COATED BLADE 2.86 ST (ELECTRODE) IMPLANT
ELECT NDL BLADE 2-5/6 (NEEDLE) ×1 IMPLANT
ELECT NEEDLE BLADE 2-5/6 (NEEDLE) IMPLANT
ELECT REM PT RETURN 9FT ADLT (ELECTROSURGICAL) ×3
ELECTRODE REM PT RTRN 9FT ADLT (ELECTROSURGICAL) ×1 IMPLANT
GLOVE EXAM NITRILE MD LF STRL (GLOVE) ×2 IMPLANT
GLOVE SS BIOGEL STRL SZ 7.5 (GLOVE) ×2 IMPLANT
GLOVE SUPERSENSE BIOGEL SZ 7.5 (GLOVE) ×1
GLOVE SURG SS PI 7.0 STRL IVOR (GLOVE) ×3 IMPLANT
GOWN STRL REUS W/ TWL LRG LVL3 (GOWN DISPOSABLE) ×3 IMPLANT
GOWN STRL REUS W/ TWL XL LVL3 (GOWN DISPOSABLE) ×2 IMPLANT
GOWN STRL REUS W/TWL LRG LVL3 (GOWN DISPOSABLE) ×3
GOWN STRL REUS W/TWL XL LVL3 (GOWN DISPOSABLE) ×3
HEMOSTAT SURGICEL .5X2 ABSORB (HEMOSTASIS) IMPLANT
HEMOSTAT SURGICEL 2X14 (HEMOSTASIS) IMPLANT
IV NS 1000ML (IV SOLUTION)
IV NS 1000ML BAXH (IV SOLUTION) IMPLANT
IV NS 500ML (IV SOLUTION) ×6
IV NS 500ML BAXH (IV SOLUTION) ×4 IMPLANT
NDL PRECISIONGLIDE 27X1.5 (NEEDLE) ×1 IMPLANT
NDL SPNL 25GX3.5 QUINCKE BL (NEEDLE) IMPLANT
NEEDLE PRECISIONGLIDE 27X1.5 (NEEDLE) ×3 IMPLANT
NEEDLE SPNL 25GX3.5 QUINCKE BL (NEEDLE) IMPLANT
NS IRRIG 1000ML POUR BTL (IV SOLUTION) ×3 IMPLANT
PACK BASIN DAY SURGERY FS (CUSTOM PROCEDURE TRAY) ×3 IMPLANT
PACK ENT DAY SURGERY (CUSTOM PROCEDURE TRAY) ×3 IMPLANT
PAD CLEANER CAUTERY TIP 5X5 (MISCELLANEOUS)
PAD DRESSING TELFA 3X8 NADH (GAUZE/BANDAGES/DRESSINGS) IMPLANT
PATTIES SURGICAL .5 X3 (DISPOSABLE) ×3 IMPLANT
PENCIL BUTTON HOLSTER BLD 10FT (ELECTRODE) IMPLANT
SLEEVE SCD COMPRESS KNEE MED (MISCELLANEOUS) ×3 IMPLANT
SOLUTION ANTI FOG 6CC (MISCELLANEOUS) ×3 IMPLANT
SPONGE GAUZE 2X2 8PLY STRL LF (GAUZE/BANDAGES/DRESSINGS) ×3 IMPLANT
SUT CHROMIC 4 0 PS 2 18 (SUTURE) IMPLANT
SUT ETHILON 3 0 PS 1 (SUTURE) IMPLANT
SUT SILK 2 0 FS (SUTURE) IMPLANT
SYR 3ML 18GX1 1/2 (SYRINGE) IMPLANT
TOWEL OR 17X24 6PK STRL BLUE (TOWEL DISPOSABLE) ×6 IMPLANT
TRACKER ENT INSTRUMENT (MISCELLANEOUS) ×3 IMPLANT
TRACKER ENT PATIENT (MISCELLANEOUS) ×3 IMPLANT
TRAY DSU PREP LF (CUSTOM PROCEDURE TRAY) ×3 IMPLANT
TUBE CONNECTING 20X1/4 (TUBING) ×3 IMPLANT
TUBING STRAIGHTSHOT EPS 5PK (TUBING) ×3 IMPLANT
YANKAUER SUCT BULB TIP NO VENT (SUCTIONS) ×3 IMPLANT

## 2016-01-19 NOTE — Interval H&P Note (Signed)
History and Physical Interval Note:  01/19/2016 8:11 AM  Sharon Chapman  has presented today for surgery, with the diagnosis of chronic sinusitis  The various methods of treatment have been discussed with the patient and family. After consideration of risks, benefits and other options for treatment, the patient has consented to  Procedure(s): BILATERAL ETHMOIDECTOMY, AND BILATERAL MAXILLARY ANTROSTOMY WITH FUSION (Bilateral) TURBINATE REDUCTION (Bilateral) FUSION (Bilateral) as a surgical intervention .  The patient's history has been reviewed, patient examined, no change in status, stable for surgery.  I have reviewed the patient's chart and labs.  Questions were answered to the patient's satisfaction.     Trejan Buda

## 2016-01-19 NOTE — Anesthesia Postprocedure Evaluation (Signed)
Anesthesia Post Note  Patient: Sharon Chapman  Procedure(s) Performed: Procedure(s) (LRB): BILATERAL ETHMOIDECTOMY AND BILATERAL MAXILLARY ANTROSTOMY WITH FUSION NAVIGATION (Bilateral) TURBINATE REDUCTION (Bilateral)  Patient location during evaluation: PACU Anesthesia Type: General Level of consciousness: awake and alert Pain management: pain level controlled Vital Signs Assessment: post-procedure vital signs reviewed and stable Respiratory status: spontaneous breathing, nonlabored ventilation, respiratory function stable and patient connected to nasal cannula oxygen Cardiovascular status: blood pressure returned to baseline and stable Postop Assessment: no signs of nausea or vomiting Anesthetic complications: no    Last Vitals:  Filed Vitals:   01/19/16 1130 01/19/16 1145  BP: 133/86 136/80  Pulse:  93  Temp:    Resp: 22 22    Last Pain: There were no vitals filed for this visit.               Eugenio Dollins L

## 2016-01-19 NOTE — Anesthesia Procedure Notes (Signed)
Procedure Name: Intubation Date/Time: 01/19/2016 8:32 AM Performed by: Lyndee Leo Pre-anesthesia Checklist: Patient identified, Emergency Drugs available, Suction available and Patient being monitored Patient Re-evaluated:Patient Re-evaluated prior to inductionOxygen Delivery Method: Circle System Utilized Preoxygenation: Pre-oxygenation with 100% oxygen Intubation Type: IV induction Ventilation: Mask ventilation without difficulty Laryngoscope Size: Mac and 3 Grade View: Grade I Tube type: Oral Rae Tube size: 7.0 mm Number of attempts: 1 Airway Equipment and Method: Stylet and Oral airway Placement Confirmation: ETT inserted through vocal cords under direct vision,  positive ETCO2 and breath sounds checked- equal and bilateral Secured at: 22 cm Tube secured with: Tape Dental Injury: Teeth and Oropharynx as per pre-operative assessment

## 2016-01-19 NOTE — Brief Op Note (Signed)
01/19/2016  10:58 AM  PATIENT:  Sharon Chapman  45 y.o. female  PRE-OPERATIVE DIAGNOSIS:  chronic sinusitis  POST-OPERATIVE DIAGNOSIS:  chronic sinusitis  PROCEDURE:  Procedure(s): BILATERAL ETHMOIDECTOMY AND BILATERAL MAXILLARY ANTROSTOMY WITH FUSION NAVIGATION (Bilateral) TURBINATE REDUCTION (Bilateral)  SURGEON:  Surgeon(s) and Role:    * Rozetta Nunnery, MD - Primary  PHYSICIAN ASSISTANT:   ASSISTANTS: none   ANESTHESIA:   general  EBL:  Total I/O In: 1000 [I.V.:1000] Out: 300 [Blood:300]  BLOOD ADMINISTERED:none  DRAINS: none   LOCAL MEDICATIONS USED:  LIDOCAINE with EPI  15 cc  SPECIMEN:  No Specimen  DISPOSITION OF SPECIMEN:  N/A  COUNTS:  YES  TOURNIQUET:  * No tourniquets in log *  DICTATION: .Other Dictation: Dictation Number [id number]  PLAN OF CARE: Discharge to home after PACU  PATIENT DISPOSITION:  PACU - hemodynamically stable.   Delay start of Pharmacological VTE agent (>24hrs) due to surgical blood loss or risk of bleeding: yes

## 2016-01-19 NOTE — Anesthesia Preprocedure Evaluation (Signed)
Anesthesia Evaluation  Patient identified by MRN, date of birth, ID band Patient awake    Reviewed: Allergy & Precautions, NPO status , Patient's Chart, lab work & pertinent test results  Airway Mallampati: I  TM Distance: >3 FB Neck ROM: Full    Dental  (+) Teeth Intact, Dental Advisory Given   Pulmonary Current Smoker,  breath sounds clear to auscultation        Cardiovascular Rhythm:Regular Rate:Normal     Neuro/Psych    GI/Hepatic   Endo/Other    Renal/GU      Musculoskeletal   Abdominal   Peds  Hematology   Anesthesia Other Findings   Reproductive/Obstetrics                             Anesthesia Physical Anesthesia Plan  ASA: I  Anesthesia Plan: General   Post-op Pain Management:    Induction: Intravenous  Airway Management Planned: Oral ETT  Additional Equipment:   Intra-op Plan:   Post-operative Plan: Extubation in OR  Informed Consent: I have reviewed the patients History and Physical, chart, labs and discussed the procedure including the risks, benefits and alternatives for the proposed anesthesia with the patient or authorized representative who has indicated his/her understanding and acceptance.   Dental advisory given  Plan Discussed with: CRNA, Anesthesiologist and Surgeon  Anesthesia Plan Comments:         Anesthesia Quick Evaluation  

## 2016-01-19 NOTE — Transfer of Care (Signed)
Immediate Anesthesia Transfer of Care Note  Patient: Sharon Chapman  Procedure(s) Performed: Procedure(s): BILATERAL ETHMOIDECTOMY AND BILATERAL MAXILLARY ANTROSTOMY WITH FUSION NAVIGATION (Bilateral) TURBINATE REDUCTION (Bilateral)  Patient Location: PACU  Anesthesia Type:General  Level of Consciousness: awake, sedated and patient cooperative  Airway & Oxygen Therapy: Patient Spontanous Breathing and aerosol face mask  Post-op Assessment: Report given to RN and Post -op Vital signs reviewed and stable  Post vital signs: Reviewed and stable  Last Vitals:  Filed Vitals:   01/19/16 0718  BP: 132/91  Pulse: 78  Temp: 37.3 C  Resp: 18    Last Pain: There were no vitals filed for this visit.       Complications: No apparent anesthesia complications

## 2016-01-19 NOTE — Discharge Instructions (Addendum)
Tylenol, motrin or hydrocodone 1-2 tabs every 6 hrs prn pain Keflex 500 mg twice per day  Start tonight Elevate head and apply cool compress to nose to reduce bleeding Call office for follow up appt in 5-7 days   [phone]    Post Anesthesia Home Care Instructions  Activity: Get plenty of rest for the remainder of the day. A responsible adult should stay with you for 24 hours following the procedure.  For the next 24 hours, DO NOT: -Drive a car -Paediatric nurse -Drink alcoholic beverages -Take any medication unless instructed by your physician -Make any legal decisions or sign important papers.  Meals: Start with liquid foods such as gelatin or soup. Progress to regular foods as tolerated. Avoid greasy, spicy, heavy foods. If nausea and/or vomiting occur, drink only clear liquids until the nausea and/or vomiting subsides. Call your physician if vomiting continues.  Special Instructions/Symptoms: Your throat may feel dry or sore from the anesthesia or the breathing tube placed in your throat during surgery. If this causes discomfort, gargle with warm salt water. The discomfort should disappear within 24 hours.  If you had a scopolamine patch placed behind your ear for the management of post- operative nausea and/or vomiting:  1. The medication in the patch is effective for 72 hours, after which it should be removed.  Wrap patch in a tissue and discard in the trash. Wash hands thoroughly with soap and water. 2. You may remove the patch earlier than 72 hours if you experience unpleasant side effects which may include dry mouth, dizziness or visual disturbances. 3. Avoid touching the patch. Wash your hands with soap and water after contact with the patch.    Call your surgeon if you experience:   1.  Fever over 101.0. 2.  Inability to urinate. 3.  Nausea and/or vomiting. 4.  Extreme swelling or bruising at the surgical site. 5.  Continued bleeding from the incision. 6.  Increased  pain, redness or drainage from the incision. 7.  Problems related to your pain medication. 8.  Any problems and/or concerns

## 2016-01-22 ENCOUNTER — Encounter (HOSPITAL_BASED_OUTPATIENT_CLINIC_OR_DEPARTMENT_OTHER): Payer: Self-pay | Admitting: Otolaryngology

## 2016-01-22 NOTE — Op Note (Signed)
NAME:  Sharon Chapman, Sharon Chapman                  ACCOUNT NO.:  MEDICAL RECORD NO.:  [id number]  LOCATION:                                 FACILITY:  PHYSICIAN:  Leonides Sake. Lucia Gaskins, M.D. DATE OF BIRTH:  DATE OF PROCEDURE:  01/19/2016 DATE OF DISCHARGE:                              OPERATIVE REPORT   PREOPERATIVE DIAGNOSIS:  History of recurrent sinusitis and chronic sinusitis, left side worse than right.  POSTOPERATIVE DIAGNOSES:  History of recurrent sinusitis and chronic sinusitis, left side worse than right with polypoid changes of the left ethmoid, left maxillary and left nasofrontal duct region, turbinate hypertrophy.  OPERATION PERFORMED:  Functional endoscopic sinus surgery with bilateral total ethmoidectomies and bilateral maxillary ostial enlargement with removal of polyps from the left maxillary sinus.  Bilateral inferior turbinate reductions with Medtronic turbinate blade.  SURGEON:  Leonides Sake. Lucia Gaskins, M.D.  ANESTHESIA:  General endotracheal.  COMPLICATIONS:  None.  ESTIMATED BLOOD LOSS:  250 mL.  DESCRIPTION OF PROCEDURE:  After adequate endotracheal anesthesia, the patient's nose was prepped with Betadine solution.  Nose was then further prepped with cotton pledgets, soaked in Afrin and middle meatus areas were injected with 10-12 mL of Xylocaine with epinephrine.  Next, the fusion device was calibrated in order to utilize it during the case. She has had a recent CT scan, which showed complete opacification of the left ethmoid, left maxillary and frontal sinus and the left side was approached first.  She had slight septal deviation to the left with narrowed left nasal passageway compared to the right, but minimal difference.  Uncinate process was incised and removed with sickle knife. She had a moderate amount of bleeding on opening up the anterior ethmoid area.  Using the microdebrider under fusion guidance, anterior and posterior ethmoid area were opened up.   The patient had polypoid disease throughout the ethmoid area extending up into the nasal frontal area. Maxillary ostia were identified and enlarged with backbiting and straight through-cut forceps to approximately 1 cm to may be a 1.5 cm in size and the patient had polypoid disease throughout the left maxillary sinus.  The curved microdebrider was used to remove the polypoid disease from the maxillary sinus, although there was significant polypoid disease throughout the wall, all the walls of the maxillary sinus.  The nasofrontal duct was evaluated, it has some polypoid disease up the nasofrontal duct and the inferior aspect of the nasofrontal duct was opened up with a microdebrider.  This completed the left side.  Next, the right side was approached.  Again, the uncinate process was incised and the anterior and posterior ethmoid cells were opened up with microdebrider.  The right side had no significant polypoid disease.  The maxillary ostia were identified, it was enlarged to approximately 1 cm or less in size.  The right maxillary sinus was clear.  She had minimal disease process on the right.  Next, using the Medtronic turbinate blade, submucosal turbinate reductions were performed bilaterally and the turbinates were outfractured.  Of note, she has had a large bony spur posteriorly on the left side that was removed with straight through- cut forceps and the mucosa was cauterized after removing  the bony spur on the left side.  This completed the procedure.  80 mg of Depo-Medrol were injected predominately on the left side where she had significant polypoid disease.  The nose was packed with Nasopore on each side.  She had minimal bleeding after packing the Nasopore, no further packing was placed.  She will be discharged home later today on Keflex 500 mg b.i.d. for 10 days and Tylenol, Motrin or hydrocodone p.r.n. pain.  We will have her follow up in my office in 1 week for  recheck.          ______________________________ Leonides Sake. Lucia Gaskins, M.D.     CEN/MEDQ  D:  01/19/2016  T:  01/19/2016  Job:  [id number]

## 2016-10-01 DIAGNOSIS — B9789 Other viral agents as the cause of diseases classified elsewhere: Secondary | ICD-10-CM | POA: Diagnosis not present

## 2016-10-01 DIAGNOSIS — J040 Acute laryngitis: Secondary | ICD-10-CM | POA: Diagnosis not present

## 2016-10-01 DIAGNOSIS — J069 Acute upper respiratory infection, unspecified: Secondary | ICD-10-CM | POA: Diagnosis not present

## 2016-10-16 DIAGNOSIS — J040 Acute laryngitis: Secondary | ICD-10-CM | POA: Diagnosis not present

## 2016-10-16 DIAGNOSIS — M25522 Pain in left elbow: Secondary | ICD-10-CM | POA: Diagnosis not present

## 2016-10-16 DIAGNOSIS — K219 Gastro-esophageal reflux disease without esophagitis: Secondary | ICD-10-CM | POA: Diagnosis not present

## 2017-01-14 DIAGNOSIS — L821 Other seborrheic keratosis: Secondary | ICD-10-CM | POA: Diagnosis not present

## 2017-01-14 DIAGNOSIS — D229 Melanocytic nevi, unspecified: Secondary | ICD-10-CM | POA: Diagnosis not present

## 2017-01-24 DIAGNOSIS — I10 Essential (primary) hypertension: Secondary | ICD-10-CM | POA: Diagnosis not present

## 2017-01-24 DIAGNOSIS — G4700 Insomnia, unspecified: Secondary | ICD-10-CM | POA: Diagnosis not present

## 2017-01-24 DIAGNOSIS — Z0000 Encounter for general adult medical examination without abnormal findings: Secondary | ICD-10-CM | POA: Diagnosis not present

## 2017-02-05 ENCOUNTER — Other Ambulatory Visit: Payer: Self-pay | Admitting: Physician Assistant

## 2017-02-05 ENCOUNTER — Other Ambulatory Visit (HOSPITAL_COMMUNITY)
Admission: RE | Admit: 2017-02-05 | Discharge: 2017-02-05 | Disposition: A | Discharge status: Home / Self Care | Payer: Commercial Managed Care - HMO | Source: Ambulatory Visit | Attending: Physician Assistant | Admitting: Physician Assistant

## 2017-02-05 DIAGNOSIS — Z0000 Encounter for general adult medical examination without abnormal findings: Secondary | ICD-10-CM | POA: Diagnosis present

## 2017-02-07 LAB — CYTOLOGY - PAP
Diagnosis: NEGATIVE
HPV (WINDOPATH): NOT DETECTED

## 2017-02-24 DIAGNOSIS — R748 Abnormal levels of other serum enzymes: Secondary | ICD-10-CM | POA: Diagnosis not present

## 2017-02-24 DIAGNOSIS — R030 Elevated blood-pressure reading, without diagnosis of hypertension: Secondary | ICD-10-CM | POA: Diagnosis not present

## 2017-02-24 DIAGNOSIS — K219 Gastro-esophageal reflux disease without esophagitis: Secondary | ICD-10-CM | POA: Diagnosis not present

## 2017-04-09 DIAGNOSIS — R945 Abnormal results of liver function studies: Secondary | ICD-10-CM | POA: Diagnosis not present

## 2017-04-09 DIAGNOSIS — Z87891 Personal history of nicotine dependence: Secondary | ICD-10-CM | POA: Diagnosis not present

## 2017-05-07 DIAGNOSIS — R590 Localized enlarged lymph nodes: Secondary | ICD-10-CM | POA: Diagnosis not present

## 2017-05-30 DIAGNOSIS — R590 Localized enlarged lymph nodes: Secondary | ICD-10-CM | POA: Diagnosis not present

## 2017-06-03 DIAGNOSIS — Z1231 Encounter for screening mammogram for malignant neoplasm of breast: Secondary | ICD-10-CM | POA: Diagnosis not present

## 2017-06-03 DIAGNOSIS — Z803 Family history of malignant neoplasm of breast: Secondary | ICD-10-CM | POA: Diagnosis not present

## 2017-06-13 DIAGNOSIS — L989 Disorder of the skin and subcutaneous tissue, unspecified: Secondary | ICD-10-CM | POA: Diagnosis not present

## 2017-06-24 DIAGNOSIS — G4763 Sleep related bruxism: Secondary | ICD-10-CM | POA: Diagnosis not present

## 2017-07-04 DIAGNOSIS — C44519 Basal cell carcinoma of skin of other part of trunk: Secondary | ICD-10-CM | POA: Diagnosis not present

## 2017-07-04 DIAGNOSIS — D492 Neoplasm of unspecified behavior of bone, soft tissue, and skin: Secondary | ICD-10-CM | POA: Diagnosis not present

## 2017-07-04 DIAGNOSIS — L409 Psoriasis, unspecified: Secondary | ICD-10-CM | POA: Diagnosis not present

## 2017-07-04 DIAGNOSIS — C4491 Basal cell carcinoma of skin, unspecified: Secondary | ICD-10-CM

## 2017-07-04 HISTORY — DX: Basal cell carcinoma of skin, unspecified: C44.91

## 2017-07-07 DIAGNOSIS — G4763 Sleep related bruxism: Secondary | ICD-10-CM | POA: Diagnosis not present

## 2017-07-10 ENCOUNTER — Encounter: Payer: Self-pay | Admitting: Cardiovascular Disease

## 2017-07-10 ENCOUNTER — Ambulatory Visit (INDEPENDENT_AMBULATORY_CARE_PROVIDER_SITE_OTHER): Payer: 59 | Admitting: Cardiovascular Disease

## 2017-07-10 VITALS — BP 148/100 | HR 63 | Ht 63.5 in | Wt 158.2 lb

## 2017-07-10 DIAGNOSIS — I10 Essential (primary) hypertension: Secondary | ICD-10-CM | POA: Diagnosis not present

## 2017-07-10 DIAGNOSIS — R0602 Shortness of breath: Secondary | ICD-10-CM

## 2017-07-10 DIAGNOSIS — R002 Palpitations: Secondary | ICD-10-CM

## 2017-07-10 NOTE — Progress Notes (Signed)
Cardiology Office Note Date:  07/10/2017   ID:  Sharon Chapman, DOB 1970/11/03, MRN [id number]  PCP:  Maude Leriche, PA-C  Cardiologist:  Sherren Mocha, MD    Chief Complaint  Patient presents with  . New Patient (Initial Visit)    "racing heart"     History of Present Illness: Sharon Chapman is a 46 y.o. female who presents for evaluation of heart palpitations. She describes sudden onset of fast heartbeats   She has adjusted medications with help of her physician to try to improve her symptoms.  She hasn't appreciated any improvement with these medicine adjustments. Palpitations last approximately 5 minutes and resolve spontaneously. States that she has used a pulse oximeter that has interpreted her HR as high as 200 bpm. She describes lightheadedness and blurry vision when this has happened.   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 Her blood pressure has been markedly elevated when she comes in for doctor's visits. She's not sure about her home readings. She tried to use her sister's cough but could not get an accurate reading. She's never been treated for high blood pressure.  She denies headache or syncope. She denies orthopnea, PND, or leg swelling.  Past Medical History:  Diagnosis Date  . Anxiety    anxiety, ADD mixed   . Arthritis    cervical myelopathy , spondylosis, recently reports low back pain   . Depression   . Heart murmur    h/o suppsed heart murmur, seen cardiologist about 20 yrs. ago, no f/u since, sees [facility]  currently, no recenmt mention of heart murmur  . Heart murmur    told many yrs. ago that she had a heart murmur, doesn't ever remember having any test related  to the finding   . Mental disorder   . Seasonal allergies     Past Surgical History:  Procedure Laterality Date  . ANTERIOR CERVICAL DECOMP/DISCECTOMY FUSION  05/27/2012   Procedure: ANTERIOR CERVICAL DECOMPRESSION/DISCECTOMY  FUSION 2 LEVELS;  Surgeon: Ophelia Charter, MD;  Location: [facility];  Service: Neurosurgery;  Laterality: N/A;  Cervical five six, cervical six seven anterior cervical decompression with fusion interbody prothesis plating and bonegraft  . BREAST ENHANCEMENT SURGERY  2004  . CARPAL TUNNEL RELEASE Left 12/06/2013   Procedure: CARPAL TUNNEL RELEASE;  Surgeon: Schuyler Amor, MD;  Location: [facility];  Service: Orthopedics;  Laterality: Left;  . CESAREAN SECTION     x2 , spinal , or epidural   . EAR CYST EXCISION Left 12/06/2013   Procedure: CYST REMOVAL;  Surgeon: Schuyler Amor, MD;  Location: [facility];  Service: Orthopedics;  Laterality: Left;  . KNEE ARTHROSCOPY     left  . SINUS ENDO WITH FUSION Bilateral 01/19/2016   Procedure: BILATERAL ETHMOIDECTOMY AND BILATERAL MAXILLARY ANTROSTOMY WITH FUSION NAVIGATION;  Surgeon: Rozetta Nunnery, MD;  Location: [facility];  Service: ENT;  Laterality: Bilateral;  . TRIGGER FINGER RELEASE Left 12/06/2013   Procedure: LEFT CARPAL TUNNEL RELEASE AND LEFT THUMB A-1 PULLY RELEASE,LEFT WRIST DORSEL MASS EXCISION;  Surgeon: Schuyler Amor, MD;  Location: [facility];  Service: Orthopedics;  Laterality: Left;  . TURBINATE REDUCTION Bilateral 01/19/2016   Procedure: TURBINATE REDUCTION;  Surgeon: Rozetta Nunnery, MD;  Location: [facility];  Service: ENT;  Laterality: Bilateral;    Current Outpatient Prescriptions  Medication Sig Dispense Refill  . ALPRAZolam (XANAX) 0.5  MG tablet Take 0.5 mg by mouth at bedtime as needed (heart rhythm).     Marland Kitchen BIOTIN PO Take 1 Dose by mouth daily.    . fluticasone (FLONASE) 50 MCG/ACT nasal spray Place 2 sprays into both nostrils at bedtime.  6  . loratadine (CLARITIN) 10 MG tablet Take 10 mg by mouth daily as needed for allergies.     . Methylcobalamin 1 MG CHEW Chew 1 tablet by mouth daily.    . montelukast (SINGULAIR) 10 MG  tablet Take 10 mg by mouth at bedtime.    Marland Kitchen OVER THE COUNTER MEDICATION Take 1 Dose by mouth daily. Mega Red    . pantoprazole (PROTONIX) 20 MG tablet Take 20 mg by mouth daily.    . Saccharomyces boulardii (PROBIOTIC) 250 MG CAPS Take 250 mg by mouth daily.    . valACYclovir (VALTREX) 500 MG tablet Take 500 mg by mouth as directed.  0  . zolpidem (AMBIEN) 10 MG tablet Take 10 mg by mouth at bedtime as needed for sleep.     No current facility-administered medications for this visit.     Allergies:   Patient has no known allergies.   Social History:  The patient  reports that she has quit smoking. Her smoking use included Cigarettes. She has a 25.00 pack-year smoking history. She has never used smokeless tobacco. She reports that she drinks alcohol. She reports that she does not use drugs.   Family History:  The patient's family history is pertinent for no premature coronary artery disease.   ROS:  Please see the history of present illness. All other systems are reviewed and negative.   PHYSICAL EXAM: VS:  BP (!) 148/100   Pulse 63   Ht 5' 3.5" (1.613 m)   Wt 158 lb 3.2 oz (71.8 kg)   BMI 27.58 kg/m  , BMI Body mass index is 27.58 kg/m. GEN: Well nourished, well developed, in no acute distress  HEENT: normal  Neck: no JVD, no masses. No carotid bruits Cardiac: RRR without murmur or gallop                Respiratory:  clear to auscultation bilaterally, normal work of breathing GI: soft, nontender, nondistended, + BS MS: no deformity or atrophy  Ext: no pretibial edema, pedal pulses 2+= bilaterally Skin: warm and dry, no rash Neuro:  Strength and sensation are intact Psych: euthymic mood, full affect  EKG:  EKG is ordered today. The ekg ordered today shows normal sinus rhythm 63 bpm,possible RVH  Recent Labs: No results found for requested labs within last 8760 hours.   Lipid Panel  No results found for: CHOL, TRIG, HDL, CHOLHDL, VLDL, LDLCALC, LDLDIRECT    Wt Readings  from Last 3 Encounters:  07/10/17 158 lb 3.2 oz (71.8 kg)  01/19/16 138 lb (62.6 kg)  12/21/13 143 lb (64.9 kg)     ASSESSMENT AND PLAN: 1.  Tachycardia-palpitations:As detailed above, a pulse oximetry monitor has given very high heart rate readings. This raises suspicion for paroxysmal SVT. The patient has associated lightheadedness and shortness of breath. I have recommended an event monitor. We'll check an echocardiogram to evaluate for any structural heart disease. Her exam is benign today. Her EKG shows no significant conduction abnormality.  2. High blood pressure: Question whether this is white coat hypertension which would be fairly marked or whether this patient may have undiagnosed/untreated hypertension. Will arrange a 24-hour home blood pressure monitor. I suspect she will require antihypertensive medication.  We will check a metabolic panel when she returns for her echocardiogram to make sure there is no renal dysfunction present.  We will also add a lipid panel to help risk stratify her.  Current medicines are reviewed with the patient today.  The patient does not have concerns regarding medicines.  Labs/ tests ordered today include:   Orders Placed This Encounter  Procedures  . CARDIAC EVENT MONITOR  . HOLTER MONITOR - 24 HOUR  . EKG 12-Lead  . ECHOCARDIOGRAM COMPLETE    Disposition:   FU pending test results  Signed, Sherren Mocha, MD  07/10/2017 5:34 PM    [address], [place], [place]  [address] Phone: [phone]; Fax: [phone]

## 2017-07-10 NOTE — Patient Instructions (Signed)
Medication Instructions:  Your provider recommends that you continue on your current medications as directed. Please refer to the Current Medication list given to you today.    Labwork: None  Testing/Procedures: Your physician has recommended that you wear an event monitor. Event monitors are medical devices that record the heart's electrical activity. Doctors most often [place] these monitors to diagnose arrhythmias. Arrhythmias are problems with the speed or rhythm of the heartbeat. The monitor is a small, portable device. You can wear one while you do your normal daily activities. This is usually used to diagnose what is causing palpitations/syncope (passing out).   Your provider has requested that you have an echocardiogram. Echocardiography is a painless test that uses sound waves to create images of your heart. It provides your doctor with information about the size and shape of your heart and how well your heart's chambers and valves are working. This procedure takes approximately one hour. There are no restrictions for this procedure.   Dr. Burt Knack recommends you wear an AMBULATORY BLOOD PRESSURE MONITOR.  Follow-Up: Your provider recommends that you schedule a follow-up appointment AS NEEDED with Dr. Burt Knack pending study results.  Any Other Special Instructions Will Be Listed Below (If Applicable).     If you need a refill on your cardiac medications before your next appointment, please call your pharmacy.

## 2017-07-11 ENCOUNTER — Telehealth: Payer: Self-pay

## 2017-07-11 NOTE — Telephone Encounter (Signed)
Called to confirm with patient she will come to appointment 11/12 fasting for blood work. She was grateful for call and agrees with treatment plan.

## 2017-07-28 ENCOUNTER — Other Ambulatory Visit: Payer: 59 | Admitting: *Deleted

## 2017-07-28 ENCOUNTER — Other Ambulatory Visit: Payer: Self-pay

## 2017-07-28 ENCOUNTER — Ambulatory Visit (HOSPITAL_COMMUNITY): Payer: 59 | Attending: Cardiology

## 2017-07-28 ENCOUNTER — Ambulatory Visit (INDEPENDENT_AMBULATORY_CARE_PROVIDER_SITE_OTHER): Payer: 59

## 2017-07-28 DIAGNOSIS — R002 Palpitations: Secondary | ICD-10-CM

## 2017-07-28 DIAGNOSIS — R0602 Shortness of breath: Secondary | ICD-10-CM

## 2017-07-28 DIAGNOSIS — I071 Rheumatic tricuspid insufficiency: Secondary | ICD-10-CM | POA: Diagnosis not present

## 2017-07-28 DIAGNOSIS — I10 Essential (primary) hypertension: Secondary | ICD-10-CM | POA: Diagnosis not present

## 2017-07-28 LAB — COMPREHENSIVE METABOLIC PANEL
A/G RATIO: 2 (ref 1.2–2.2)
ALBUMIN: 4.3 g/dL (ref 3.5–5.5)
ALK PHOS: 95 IU/L (ref 39–117)
ALT: 19 IU/L (ref 0–32)
AST: 23 IU/L (ref 0–40)
BILIRUBIN TOTAL: 0.6 mg/dL (ref 0.0–1.2)
BUN / CREAT RATIO: 12 (ref 9–23)
BUN: 10 mg/dL (ref 6–24)
CHLORIDE: 103 mmol/L (ref 96–106)
CO2: 24 mmol/L (ref 20–29)
Calcium: 9.5 mg/dL (ref 8.7–10.2)
Creatinine, Ser: 0.85 mg/dL (ref 0.57–1.00)
GFR calc Af Amer: 94 mL/min/{1.73_m2} (ref >59)
GFR calc non Af Amer: 82 mL/min/{1.73_m2} (ref >59)
GLUCOSE: 88 mg/dL (ref 65–99)
Globulin, Total: 2.2 g/dL (ref 1.5–4.5)
POTASSIUM: 4.5 mmol/L (ref 3.5–5.2)
Sodium: 142 mmol/L (ref 134–144)
Total Protein: 6.5 g/dL (ref 6.0–8.5)

## 2017-07-28 LAB — LIPID PANEL
CHOLESTEROL TOTAL: 191 mg/dL (ref 100–199)
Chol/HDL Ratio: 3.4 ratio (ref 0.0–4.4)
HDL: 56 mg/dL (ref >39)
LDL Calculated: 116 mg/dL — ABNORMAL HIGH (ref 0–99)
Triglycerides: 93 mg/dL (ref 0–149)
VLDL CHOLESTEROL CAL: 19 mg/dL (ref 5–40)

## 2017-07-31 DIAGNOSIS — C44519 Basal cell carcinoma of skin of other part of trunk: Secondary | ICD-10-CM | POA: Diagnosis not present

## 2017-08-05 ENCOUNTER — Encounter: Payer: Self-pay | Admitting: *Deleted

## 2017-08-05 ENCOUNTER — Ambulatory Visit (INDEPENDENT_AMBULATORY_CARE_PROVIDER_SITE_OTHER): Payer: 59

## 2017-08-05 DIAGNOSIS — I10 Essential (primary) hypertension: Secondary | ICD-10-CM

## 2017-08-05 NOTE — Progress Notes (Signed)
Patient ID: Sharon Chapman, female   DOB: June 09, 1971, 46 y.o.   MRN: [id number]  24 hour ambulatory blood pressure monitor applied to patient using standard adult cuff.

## 2017-08-14 ENCOUNTER — Telehealth: Payer: Self-pay

## 2017-08-14 NOTE — Telephone Encounter (Signed)
ABPM showed average BP=119/76 and average HR=70.  Per Dr. Burt Knack, "normal ambulatory BP monitor. Cont same Rx"  Per DPR form, left detailed message with results on VM. Instructed patient to call with any questions or concerns.

## 2017-10-13 DIAGNOSIS — G4700 Insomnia, unspecified: Secondary | ICD-10-CM | POA: Diagnosis not present

## 2018-03-09 DIAGNOSIS — I10 Essential (primary) hypertension: Secondary | ICD-10-CM | POA: Diagnosis not present

## 2018-04-14 DIAGNOSIS — M9903 Segmental and somatic dysfunction of lumbar region: Secondary | ICD-10-CM | POA: Diagnosis not present

## 2018-04-20 DIAGNOSIS — M9903 Segmental and somatic dysfunction of lumbar region: Secondary | ICD-10-CM | POA: Diagnosis not present

## 2018-04-27 DIAGNOSIS — M9903 Segmental and somatic dysfunction of lumbar region: Secondary | ICD-10-CM | POA: Diagnosis not present

## 2018-05-11 DIAGNOSIS — M9903 Segmental and somatic dysfunction of lumbar region: Secondary | ICD-10-CM | POA: Diagnosis not present

## 2018-05-19 DIAGNOSIS — M9903 Segmental and somatic dysfunction of lumbar region: Secondary | ICD-10-CM | POA: Diagnosis not present

## 2018-05-29 DIAGNOSIS — Z803 Family history of malignant neoplasm of breast: Secondary | ICD-10-CM | POA: Diagnosis not present

## 2018-05-29 DIAGNOSIS — Z1231 Encounter for screening mammogram for malignant neoplasm of breast: Secondary | ICD-10-CM | POA: Diagnosis not present

## 2018-06-01 DIAGNOSIS — K625 Hemorrhage of anus and rectum: Secondary | ICD-10-CM | POA: Diagnosis not present

## 2018-07-31 DIAGNOSIS — R079 Chest pain, unspecified: Secondary | ICD-10-CM | POA: Diagnosis not present

## 2018-07-31 DIAGNOSIS — B029 Zoster without complications: Secondary | ICD-10-CM | POA: Diagnosis not present

## 2018-07-31 DIAGNOSIS — R030 Elevated blood-pressure reading, without diagnosis of hypertension: Secondary | ICD-10-CM | POA: Diagnosis not present

## 2018-08-03 DIAGNOSIS — I10 Essential (primary) hypertension: Secondary | ICD-10-CM | POA: Diagnosis not present

## 2018-08-03 DIAGNOSIS — K219 Gastro-esophageal reflux disease without esophagitis: Secondary | ICD-10-CM | POA: Diagnosis not present

## 2018-08-03 DIAGNOSIS — R0683 Snoring: Secondary | ICD-10-CM | POA: Diagnosis not present

## 2018-10-12 DIAGNOSIS — B0223 Postherpetic polyneuropathy: Secondary | ICD-10-CM | POA: Diagnosis not present

## 2018-10-12 DIAGNOSIS — L219 Seborrheic dermatitis, unspecified: Secondary | ICD-10-CM | POA: Diagnosis not present

## 2019-01-25 DIAGNOSIS — G4700 Insomnia, unspecified: Secondary | ICD-10-CM | POA: Diagnosis not present

## 2019-02-01 ENCOUNTER — Other Ambulatory Visit: Payer: Self-pay

## 2019-02-01 DIAGNOSIS — L219 Seborrheic dermatitis, unspecified: Secondary | ICD-10-CM | POA: Diagnosis not present

## 2019-02-01 DIAGNOSIS — D485 Neoplasm of uncertain behavior of skin: Secondary | ICD-10-CM | POA: Diagnosis not present

## 2019-09-06 ENCOUNTER — Other Ambulatory Visit: Payer: Self-pay

## 2021-10-24 DIAGNOSIS — J309 Allergic rhinitis, unspecified: Secondary | ICD-10-CM | POA: Diagnosis not present

## 2021-10-24 DIAGNOSIS — H66001 Acute suppurative otitis media without spontaneous rupture of ear drum, right ear: Secondary | ICD-10-CM | POA: Diagnosis not present

## 2021-10-24 DIAGNOSIS — D649 Anemia, unspecified: Secondary | ICD-10-CM | POA: Diagnosis not present

## 2021-10-24 DIAGNOSIS — R1013 Epigastric pain: Secondary | ICD-10-CM | POA: Diagnosis not present

## 2021-10-25 ENCOUNTER — Other Ambulatory Visit: Payer: Self-pay | Admitting: Family Medicine

## 2021-10-25 DIAGNOSIS — R1013 Epigastric pain: Secondary | ICD-10-CM

## 2021-10-26 ENCOUNTER — Other Ambulatory Visit: Payer: Self-pay | Admitting: Family Medicine

## 2021-10-26 DIAGNOSIS — R748 Abnormal levels of other serum enzymes: Secondary | ICD-10-CM

## 2021-10-26 DIAGNOSIS — R1013 Epigastric pain: Secondary | ICD-10-CM

## 2021-10-26 DIAGNOSIS — D649 Anemia, unspecified: Secondary | ICD-10-CM

## 2021-11-01 ENCOUNTER — Ambulatory Visit
Admission: RE | Admit: 2021-11-01 | Discharge: 2021-11-01 | Disposition: A | Discharge status: Home / Self Care | Payer: 59 | Source: Ambulatory Visit | Attending: Family Medicine | Admitting: Family Medicine

## 2021-11-01 DIAGNOSIS — R748 Abnormal levels of other serum enzymes: Secondary | ICD-10-CM

## 2021-11-01 DIAGNOSIS — R140 Abdominal distension (gaseous): Secondary | ICD-10-CM | POA: Diagnosis not present

## 2021-11-01 DIAGNOSIS — R1013 Epigastric pain: Secondary | ICD-10-CM

## 2021-11-01 DIAGNOSIS — R197 Diarrhea, unspecified: Secondary | ICD-10-CM | POA: Diagnosis not present

## 2021-11-01 DIAGNOSIS — D649 Anemia, unspecified: Secondary | ICD-10-CM

## 2021-11-01 DIAGNOSIS — K6389 Other specified diseases of intestine: Secondary | ICD-10-CM | POA: Diagnosis not present

## 2021-11-01 MED ORDER — IOPAMIDOL (ISOVUE-300) INJECTION 61%
100.0000 mL | Freq: Once | INTRAVENOUS | Status: AC | PRN
  Administered 2021-11-01: 100 mL via INTRAVENOUS

## 2021-11-19 DIAGNOSIS — K529 Noninfective gastroenteritis and colitis, unspecified: Secondary | ICD-10-CM | POA: Diagnosis not present

## 2021-11-19 DIAGNOSIS — K625 Hemorrhage of anus and rectum: Secondary | ICD-10-CM | POA: Diagnosis not present

## 2021-11-19 DIAGNOSIS — R197 Diarrhea, unspecified: Secondary | ICD-10-CM | POA: Diagnosis not present

## 2021-11-19 DIAGNOSIS — R933 Abnormal findings on diagnostic imaging of other parts of digestive tract: Secondary | ICD-10-CM | POA: Diagnosis not present

## 2021-12-13 DIAGNOSIS — R933 Abnormal findings on diagnostic imaging of other parts of digestive tract: Secondary | ICD-10-CM | POA: Diagnosis not present

## 2021-12-13 DIAGNOSIS — K529 Noninfective gastroenteritis and colitis, unspecified: Secondary | ICD-10-CM | POA: Diagnosis not present

## 2021-12-13 DIAGNOSIS — Z1211 Encounter for screening for malignant neoplasm of colon: Secondary | ICD-10-CM | POA: Diagnosis not present

## 2021-12-13 DIAGNOSIS — K6389 Other specified diseases of intestine: Secondary | ICD-10-CM | POA: Diagnosis not present

## 2021-12-19 DIAGNOSIS — K5190 Ulcerative colitis, unspecified, without complications: Secondary | ICD-10-CM | POA: Diagnosis not present

## 2021-12-25 ENCOUNTER — Ambulatory Visit: Payer: Self-pay | Admitting: Physician Assistant

## 2022-01-09 ENCOUNTER — Ambulatory Visit: Payer: 59 | Admitting: Physician Assistant

## 2022-01-09 ENCOUNTER — Encounter: Payer: Self-pay | Admitting: Physician Assistant

## 2022-01-09 DIAGNOSIS — Z1283 Encounter for screening for malignant neoplasm of skin: Secondary | ICD-10-CM

## 2022-01-09 DIAGNOSIS — D485 Neoplasm of uncertain behavior of skin: Secondary | ICD-10-CM

## 2022-01-09 DIAGNOSIS — D225 Melanocytic nevi of trunk: Secondary | ICD-10-CM | POA: Diagnosis not present

## 2022-01-09 DIAGNOSIS — Z808 Family history of malignant neoplasm of other organs or systems: Secondary | ICD-10-CM

## 2022-01-09 DIAGNOSIS — L720 Epidermal cyst: Secondary | ICD-10-CM | POA: Diagnosis not present

## 2022-01-09 DIAGNOSIS — D229 Melanocytic nevi, unspecified: Secondary | ICD-10-CM

## 2022-01-09 DIAGNOSIS — B001 Herpesviral vesicular dermatitis: Secondary | ICD-10-CM

## 2022-01-09 DIAGNOSIS — Z85828 Personal history of other malignant neoplasm of skin: Secondary | ICD-10-CM

## 2022-01-09 HISTORY — DX: Melanocytic nevi, unspecified: D22.9

## 2022-01-09 NOTE — Patient Instructions (Addendum)
Advance laser for facial redness Biopsy, Surgery (Curettage) & Surgery (Excision) Aftercare Instructions ? ?1. Okay to remove bandage in 24 hours ? ?2. Wash area with soap and water ? ?3. Apply Vaseline to area twice daily until healed (Not Neosporin) ? ?4. Okay to cover with a Band-Aid to decrease the chance of infection or prevent irritation from clothing; also it's okay to uncover lesion at home. ? ?5. Suture instructions: return to our office in 7-10 or 10-14 days for a nurse visit for suture removal. Variable healing with sutures, if pain or itching occurs call our office. It's okay to shower or bathe 24 hours after sutures are given. ? ?6. The following risks may occur after a biopsy, curettage or excision: bleeding, scarring, discoloration, recurrence, infection (redness, yellow drainage, pain or swelling). ? ?7. For questions, concerns and results call our office at [facility] before 4pm & Friday before 3pm. Biopsy results will be available in 1 week. ? ?

## 2022-01-09 NOTE — Progress Notes (Signed)
? ?  Follow-Up Visit ?  ?Subjective  ?Sharon Chapman is a 51 y.o. female who presents for the following: Annual Exam (Patient here today for skin check. Per patient she has a lesion on her right temple x 1 year no bleeding, no pain. Check raised lesion on left arm x 1 year no bleeding no pain. Check lesion on left chin x years no pain, no bleeding. Personal history of non mole skin cancer. Family history of melanoma (cousin). Patient states that she will start taking Humira for UC and she has a few questions regarding the risk of skin cancer. ). ? ? ?The following portions of the chart were reviewed this encounter and updated as appropriate:  Tobacco  Allergies  Meds  Problems  Med Hx  Surg Hx  Fam Hx   ?  ? ?Objective  ?Well appearing patient in no apparent distress; mood and affect are within normal limits. ? ?A full examination was performed including scalp, head, eyes, ears, nose, lips, neck, chest, axillae, abdomen, back, buttocks, bilateral upper extremities, bilateral lower extremities, hands, feet, fingers, toes, fingernails, and toenails. All findings within normal limits unless otherwise noted below. ? ?No atypical nevi or signs of NMSC noted at the time of the visit.  ? ?Mid Lower [place] Lip ?Fever blister comes and goes. Clear today.  ? ?Chest - Medial [facility]) ?Crusted nodule. Cyst between breast not active currently . ? ?Right Upper Back ?Bichromic dark nested macule.  ? ? ? ? ? ?Assessment & Plan  ?Screening exam for skin cancer ? ?Yearly skin examinations ? ?Fever blister ?Mid [place] Lip ? ?Ok to refill her valtrex when she runs out ? ?Epidermal cyst ?Chest - Medial [facility]) ? ?Patient  advised 30 min for a punch prn ? ?Neoplasm of uncertain behavior of skin ?Right Upper Back ? ?Skin / nail biopsy ?Type of biopsy: tangential   ?Informed consent: discussed and consent obtained   ?Timeout: patient name, date of birth, surgical site, and procedure verified   ?Procedure prep:   Patient was prepped and draped in usual sterile fashion (Non sterile) ?Prep type:  Chlorhexidine ?Anesthesia: the lesion was anesthetized in a standard fashion   ?Anesthetic:  1% lidocaine w/ epinephrine 1-100,000 local infiltration ?Instrument used: flexible razor blade   ?Outcome: patient tolerated procedure well   ?Post-procedure details: wound care instructions given   ? ?Specimen 1 - Surgical pathology ?Differential Diagnosis: atypia ? ?Check Margins: yes ? ? ? ?I, Keiarra Charon, PA-C, have reviewed all documentation's for this visit.  The documentation on 01/09/22 for the exam, diagnosis, procedures and orders are all accurate and complete. ?

## 2022-01-15 ENCOUNTER — Telehealth: Payer: Self-pay | Admitting: *Deleted

## 2022-01-15 NOTE — Telephone Encounter (Signed)
Pathology to patient-surgery appointment scheduled.  

## 2022-01-15 NOTE — Telephone Encounter (Signed)
-----   Message from Warren Danes, [place] sent at 01/15/2022  8:42 AM EDT ----- ?ws ?

## 2022-01-16 ENCOUNTER — Ambulatory Visit: Payer: Self-pay | Admitting: Physician Assistant

## 2022-02-04 ENCOUNTER — Ambulatory Visit (INDEPENDENT_AMBULATORY_CARE_PROVIDER_SITE_OTHER): Payer: 59 | Admitting: Physician Assistant

## 2022-02-04 ENCOUNTER — Encounter: Payer: Self-pay | Admitting: Physician Assistant

## 2022-02-04 DIAGNOSIS — D229 Melanocytic nevi, unspecified: Secondary | ICD-10-CM

## 2022-02-04 DIAGNOSIS — D485 Neoplasm of uncertain behavior of skin: Secondary | ICD-10-CM | POA: Diagnosis not present

## 2022-02-04 DIAGNOSIS — D225 Melanocytic nevi of trunk: Secondary | ICD-10-CM

## 2022-02-04 DIAGNOSIS — L988 Other specified disorders of the skin and subcutaneous tissue: Secondary | ICD-10-CM | POA: Diagnosis not present

## 2022-02-04 NOTE — Patient Instructions (Signed)

## 2022-02-05 ENCOUNTER — Encounter: Payer: Self-pay | Admitting: Physician Assistant

## 2022-02-05 NOTE — Progress Notes (Signed)
   Follow-Up Visit   Subjective  Sharon Chapman is a 51 y.o. female who presents for the following: Procedure (Patient here today for wider shave on right upper back. ).   The following portions of the chart were reviewed this encounter and updated as appropriate:  Tobacco  Allergies  Meds  Problems  Med Hx  Surg Hx  Fam Hx      Objective  Well appearing patient in no apparent distress; mood and affect are within normal limits.  A focused examination was performed including back. Relevant physical exam findings are noted in the Assessment and Plan.  Right Upper Back White macule  Left Upper Back Dark nested macule        Assessment & Plan  Atypical mole Right Upper Back  Epidermal / dermal shaving - Right Upper Back  Lesion diameter (cm):  1.4 Informed consent: discussed and consent obtained   Timeout: patient name, date of birth, surgical site, and procedure verified   Procedure prep:  Patient was prepped and draped in usual sterile fashion Prep type:  Chlorhexidine Anesthesia: the lesion was anesthetized in a standard fashion   Anesthetic:  1% lidocaine w/ epinephrine 1-100,000 local infiltration Instrument used: DermaBlade   Hemostasis achieved with: aluminum chloride   Outcome: patient tolerated procedure well   Post-procedure details: sterile dressing applied and wound care instructions given   Dressing type: petrolatum gauze, petrolatum and bandage    Specimen 1 - Surgical pathology Differential Diagnosis: R/O Atypia  Check Margins: Yes  [id number]  Neoplasm of uncertain behavior of skin Left Upper Back  Skin / nail biopsy Type of biopsy: tangential   Informed consent: discussed and consent obtained   Timeout: patient name, date of birth, surgical site, and procedure verified   Procedure prep:  Patient was prepped and draped in usual sterile fashion (Non sterile) Prep type:  Chlorhexidine Anesthesia: the lesion was anesthetized in a  standard fashion   Anesthetic:  1% lidocaine w/ epinephrine 1-100,000 local infiltration Instrument used: flexible razor blade   Outcome: patient tolerated procedure well   Post-procedure details: wound care instructions given    Specimen 2 - Surgical pathology Differential Diagnosis: r/o atypia   Check Margins: yes    I, Dulcey Riederer, PA-C, have reviewed all documentation's for this visit.  The documentation on 02/05/22 for the exam, diagnosis, procedures and orders are all accurate and complete.

## 2022-02-12 ENCOUNTER — Telehealth: Payer: Self-pay

## 2022-02-12 NOTE — Telephone Encounter (Signed)
-----   Message from Warren Danes, [place] sent at 02/12/2022 10:47 AM EDT ----- Recheck in 6 months

## 2022-02-12 NOTE — Telephone Encounter (Signed)
Phone call to patient with her pathology results. Patient aware of results.  

## 2022-02-27 DIAGNOSIS — K5190 Ulcerative colitis, unspecified, without complications: Secondary | ICD-10-CM | POA: Diagnosis not present

## 2022-02-27 DIAGNOSIS — K625 Hemorrhage of anus and rectum: Secondary | ICD-10-CM | POA: Diagnosis not present

## 2022-02-27 DIAGNOSIS — R140 Abdominal distension (gaseous): Secondary | ICD-10-CM | POA: Diagnosis not present

## 2022-02-27 DIAGNOSIS — R197 Diarrhea, unspecified: Secondary | ICD-10-CM | POA: Diagnosis not present

## 2022-03-13 DIAGNOSIS — K5190 Ulcerative colitis, unspecified, without complications: Secondary | ICD-10-CM | POA: Diagnosis not present

## 2022-03-25 DIAGNOSIS — B009 Herpesviral infection, unspecified: Secondary | ICD-10-CM | POA: Diagnosis not present

## 2022-03-25 DIAGNOSIS — N6322 Unspecified lump in the left breast, upper inner quadrant: Secondary | ICD-10-CM | POA: Diagnosis not present

## 2022-03-25 DIAGNOSIS — Z9886 Personal history of breast implant removal: Secondary | ICD-10-CM | POA: Diagnosis not present

## 2022-04-01 IMAGING — CT CT ABD-PELV W/ CM
1 of 3 series · 13 of 32 positions shown, 18 images · IV contrast (agent unspecified)
Comparison: None.

CLINICAL DATA: Epigastric pain with bloating diarrhea and increased
lipase x1 month.

EXAM:
CT ABDOMEN AND PELVIS WITH CONTRAST
TECHNIQUE: Multidetector CT imaging of the abdomen and pelvis was performed
using the standard protocol following bolus administration of
intravenous contrast.

[Series 2: a/p w/ 5mm · axial · 0.81mm/px · z∈[-481,-41]mm · 13 of 100 slices shown, 18 images]
[im 6/100  soft-tissue]
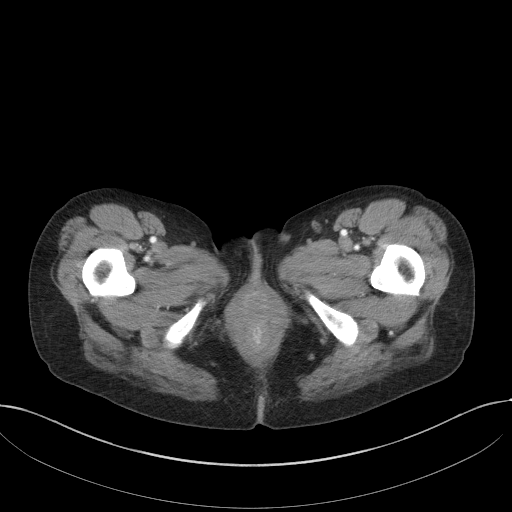
[im 6/100  bone]
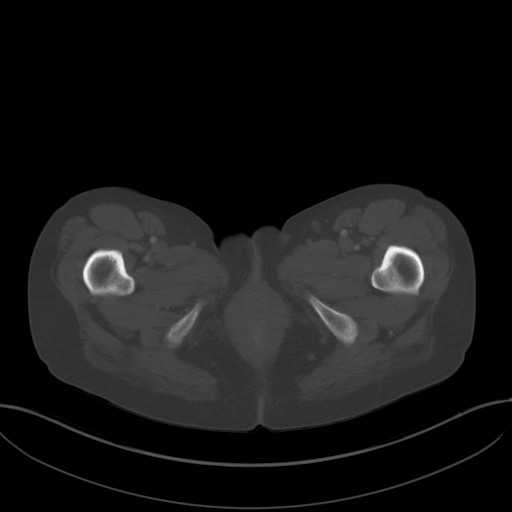
[im 16/100  soft-tissue]
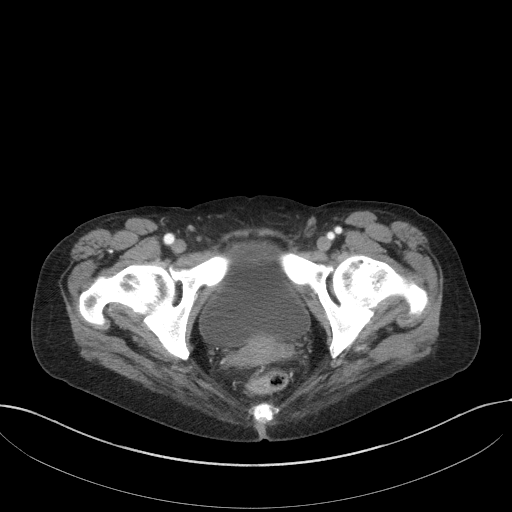
[im 21/100  soft-tissue]
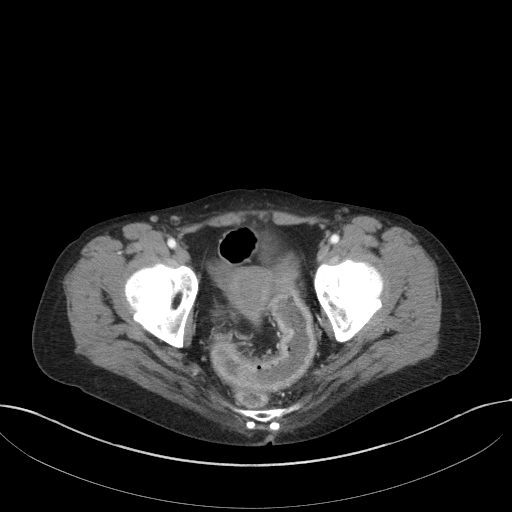
[im 32/100  soft-tissue]
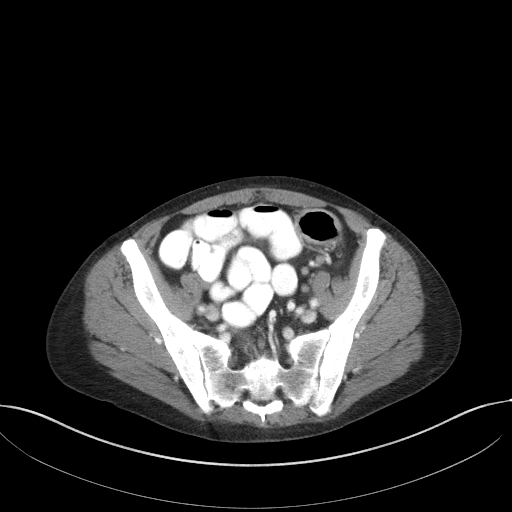
[im 37/100  soft-tissue]
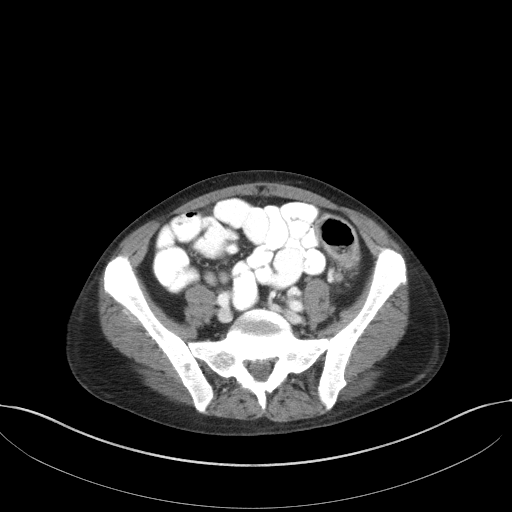
[im 47/100  soft-tissue]
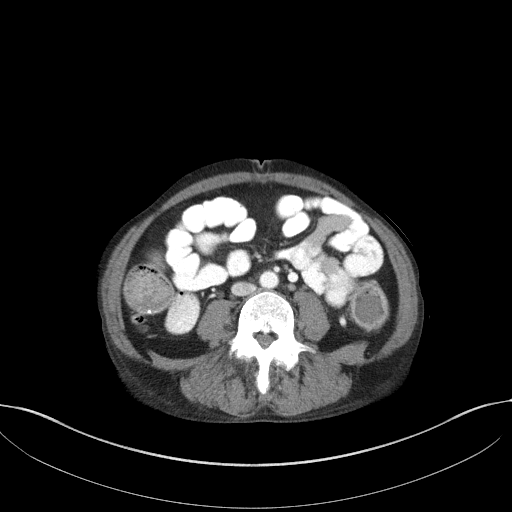
[im 53/100  soft-tissue]
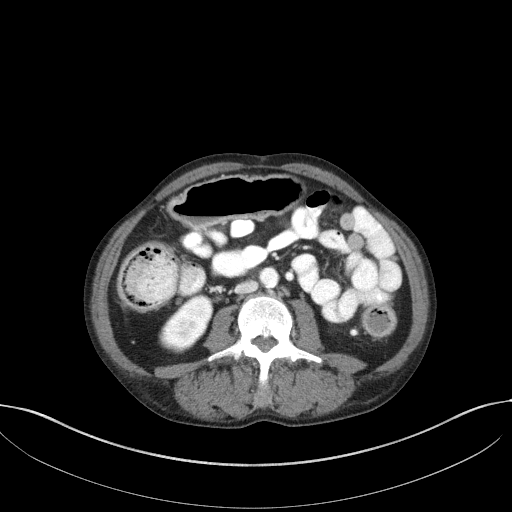
[im 63/100  soft-tissue]
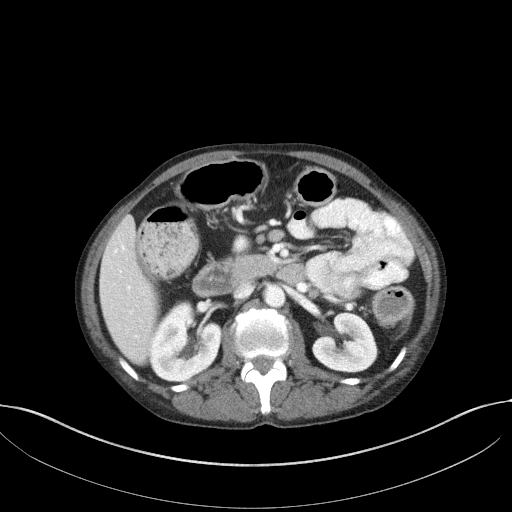
[im 68/100  soft-tissue]
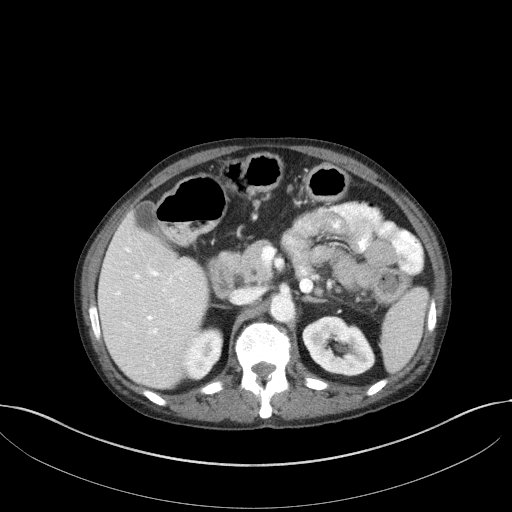
[im 68/100  bone]
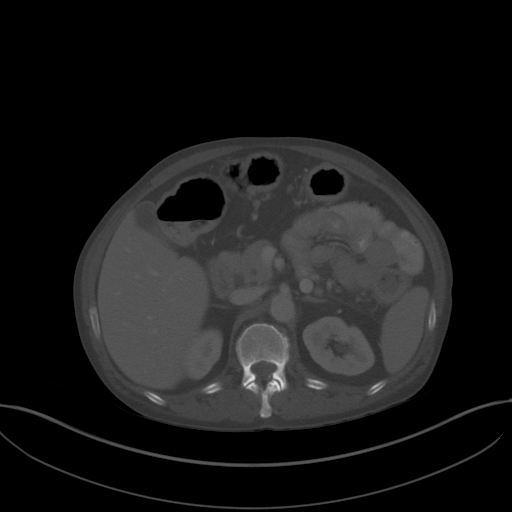
[im 79/100  soft-tissue]
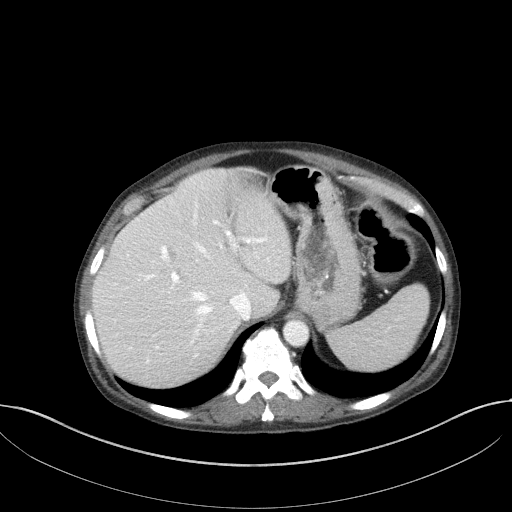
[im 79/100  lung]
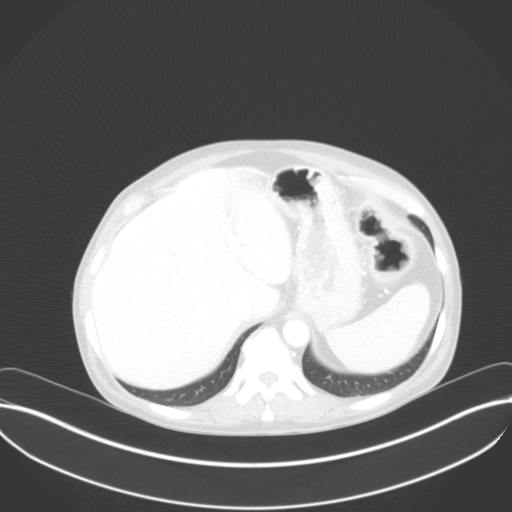
[im 84/100  soft-tissue]
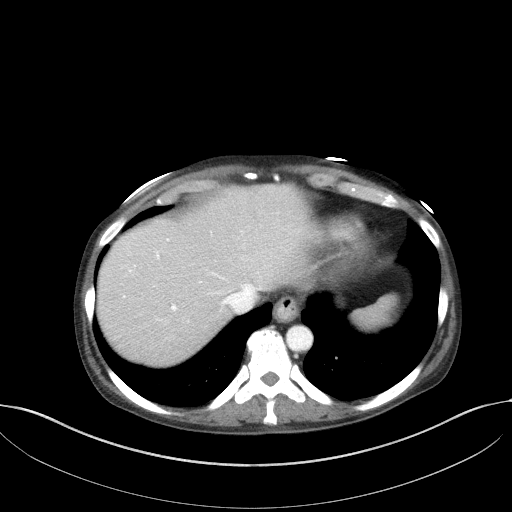
[im 84/100  lung]
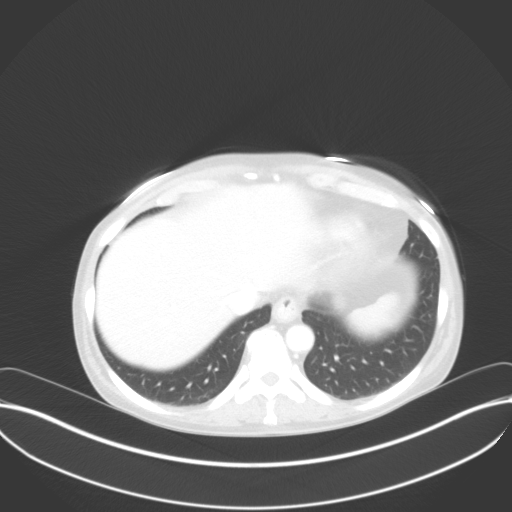
[im 89/100  lung]
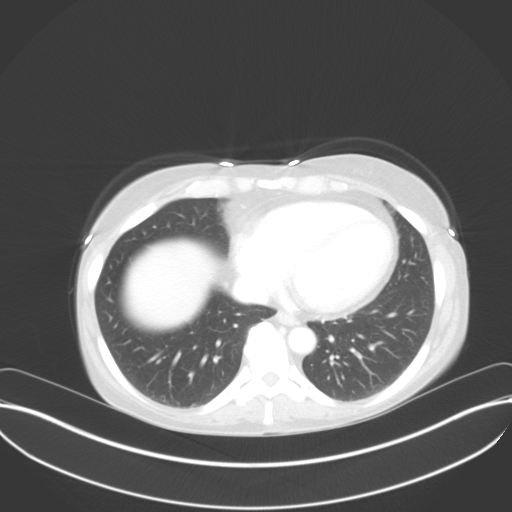
[im 94/100  soft-tissue]
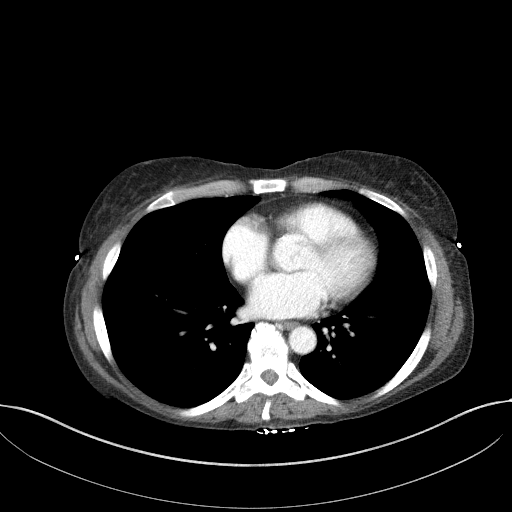
[im 94/100  lung]
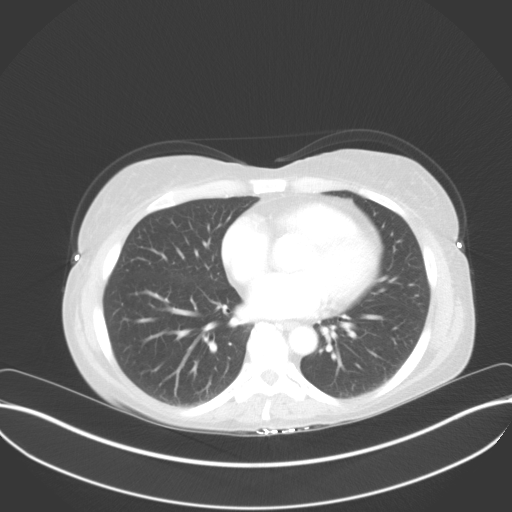

[13 of 32 positions shown; findings below may reference images not displayed]

RADIATION DOSE REDUCTION: This exam was performed according to the
departmental dose-optimization program which includes automated
exposure control, adjustment of the mA and/or kV according to
patient size and/or use of iterative reconstruction technique.

CONTRAST:  100mL BGU4NG-9II IOPAMIDOL ([id number]) INJECTION 61%
FINDINGS: Lower chest: No acute abnormality.

Hepatobiliary: No focal liver abnormality is seen. No gallstones,
gallbladder wall thickening, or biliary dilatation.

Pancreas: No pancreatic ductal dilation or evidence of acute
inflammation. A small periampullary duodenal diverticulum extends
into the pancreatic head.

Spleen: Normal size spleen without focal abnormality.

Adrenals/Urinary Tract: Bilateral adrenal glands appear normal. No
hydronephrosis. No solid enhancing renal mass. Symmetric enhancement
and excretion of contrast material from the bilateral kidneys.
Urinary bladder is unremarkable for degree of distension.

Stomach/Bowel: Radiopaque enteric contrast material traverses the
ascending colon. Stomach is unremarkable for degree of distension.
No pathologic dilation of small or large bowel. Terminal ileum
appears normal. Normal appendix. Mild wall thickening with mucosal
hyperenhancement of the proximal descending colon through the
rectum.

Vascular/Lymphatic: Aortic atherosclerosis without abdominal aortic
aneurysm. No pathologically enlarged abdominal or pelvic lymph
nodes.

Reproductive: Uterus and bilateral adnexa are unremarkable.

Other: No significant abdominopelvic free fluid. No
pneumoperitoneum. No pneumatosis.

Musculoskeletal: No acute osseous abnormality.
IMPRESSION: 1. Mild wall thickening with mucosal hyperenhancement of the
proximal descending colon through the rectum suggestive of an
infectious or inflammatory colitis.

2. Periampullary duodenal diverticulum. No evidence of acute
pancreatitis.

3.  Aortic Atherosclerosis ([id number].D).

## 2022-04-22 DIAGNOSIS — K5190 Ulcerative colitis, unspecified, without complications: Secondary | ICD-10-CM | POA: Diagnosis not present

## 2022-04-26 DIAGNOSIS — R928 Other abnormal and inconclusive findings on diagnostic imaging of breast: Secondary | ICD-10-CM | POA: Diagnosis not present

## 2022-04-26 DIAGNOSIS — Z803 Family history of malignant neoplasm of breast: Secondary | ICD-10-CM | POA: Diagnosis not present

## 2022-04-26 DIAGNOSIS — N6002 Solitary cyst of left breast: Secondary | ICD-10-CM | POA: Diagnosis not present

## 2022-07-30 ENCOUNTER — Ambulatory Visit: Payer: 59 | Admitting: Physician Assistant

## 2022-08-12 DIAGNOSIS — Z01812 Encounter for preprocedural laboratory examination: Secondary | ICD-10-CM | POA: Diagnosis not present

## 2022-08-15 DIAGNOSIS — Z01818 Encounter for other preprocedural examination: Secondary | ICD-10-CM | POA: Diagnosis not present

## 2022-08-16 DIAGNOSIS — Z01810 Encounter for preprocedural cardiovascular examination: Secondary | ICD-10-CM | POA: Diagnosis not present

## 2022-09-27 DIAGNOSIS — Z1231 Encounter for screening mammogram for malignant neoplasm of breast: Secondary | ICD-10-CM | POA: Diagnosis not present

## 2022-10-23 DIAGNOSIS — K5190 Ulcerative colitis, unspecified, without complications: Secondary | ICD-10-CM | POA: Diagnosis not present

## 2022-10-23 DIAGNOSIS — K625 Hemorrhage of anus and rectum: Secondary | ICD-10-CM | POA: Diagnosis not present

## 2022-10-23 DIAGNOSIS — R197 Diarrhea, unspecified: Secondary | ICD-10-CM | POA: Diagnosis not present

## 2022-10-24 DIAGNOSIS — J309 Allergic rhinitis, unspecified: Secondary | ICD-10-CM | POA: Diagnosis not present

## 2022-10-24 DIAGNOSIS — R69 Illness, unspecified: Secondary | ICD-10-CM | POA: Diagnosis not present

## 2022-10-24 DIAGNOSIS — K219 Gastro-esophageal reflux disease without esophagitis: Secondary | ICD-10-CM | POA: Diagnosis not present

## 2022-10-24 DIAGNOSIS — G4700 Insomnia, unspecified: Secondary | ICD-10-CM | POA: Diagnosis not present

## 2022-10-24 DIAGNOSIS — Z5181 Encounter for therapeutic drug level monitoring: Secondary | ICD-10-CM | POA: Diagnosis not present

## 2022-11-04 DIAGNOSIS — K5190 Ulcerative colitis, unspecified, without complications: Secondary | ICD-10-CM | POA: Diagnosis not present

## 2022-11-26 DIAGNOSIS — R197 Diarrhea, unspecified: Secondary | ICD-10-CM | POA: Diagnosis not present

## 2022-11-26 DIAGNOSIS — K5190 Ulcerative colitis, unspecified, without complications: Secondary | ICD-10-CM | POA: Diagnosis not present

## 2022-11-26 DIAGNOSIS — K625 Hemorrhage of anus and rectum: Secondary | ICD-10-CM | POA: Diagnosis not present

## 2022-11-26 DIAGNOSIS — K921 Melena: Secondary | ICD-10-CM | POA: Diagnosis not present

## 2023-01-10 DIAGNOSIS — I471 Supraventricular tachycardia, unspecified: Secondary | ICD-10-CM

## 2023-01-10 HISTORY — DX: Supraventricular tachycardia, unspecified: I47.10

## 2023-01-11 DIAGNOSIS — R002 Palpitations: Secondary | ICD-10-CM | POA: Diagnosis not present

## 2023-01-13 DIAGNOSIS — D649 Anemia, unspecified: Secondary | ICD-10-CM | POA: Diagnosis not present

## 2023-01-14 ENCOUNTER — Encounter: Payer: Self-pay | Admitting: Cardiology

## 2023-01-14 NOTE — Progress Notes (Signed)
EKG 01/10/2023 at 11:59 AM: Normal sinus rhythm at rate of 83 bpm, normal EKG.  EKG 01/10/2023 at 11:39 AM supraventricular tachycardia at rate of 177 bpm.  Secondary ST-T abnormality.

## 2023-01-27 DIAGNOSIS — D649 Anemia, unspecified: Secondary | ICD-10-CM | POA: Diagnosis not present

## 2023-01-27 DIAGNOSIS — L821 Other seborrheic keratosis: Secondary | ICD-10-CM | POA: Diagnosis not present

## 2023-01-27 DIAGNOSIS — Z872 Personal history of diseases of the skin and subcutaneous tissue: Secondary | ICD-10-CM | POA: Diagnosis not present

## 2023-01-27 DIAGNOSIS — L728 Other follicular cysts of the skin and subcutaneous tissue: Secondary | ICD-10-CM | POA: Diagnosis not present

## 2023-01-27 DIAGNOSIS — L814 Other melanin hyperpigmentation: Secondary | ICD-10-CM | POA: Diagnosis not present

## 2023-01-27 DIAGNOSIS — D1801 Hemangioma of skin and subcutaneous tissue: Secondary | ICD-10-CM | POA: Diagnosis not present

## 2023-01-30 ENCOUNTER — Ambulatory Visit: Payer: 59 | Admitting: Cardiology

## 2023-01-30 ENCOUNTER — Encounter: Payer: Self-pay | Admitting: Cardiology

## 2023-01-30 VITALS — BP 140/90 | HR 75 | Ht 63.5 in | Wt 140.0 lb

## 2023-01-30 DIAGNOSIS — I4719 Other supraventricular tachycardia: Secondary | ICD-10-CM

## 2023-01-30 DIAGNOSIS — R030 Elevated blood-pressure reading, without diagnosis of hypertension: Secondary | ICD-10-CM | POA: Diagnosis not present

## 2023-01-30 NOTE — Progress Notes (Signed)
Primary Physician/Referring:  Trisha Mangle, FNP  Patient ID: Sharon Chapman, female    DOB: 27-Aug-1971, 52 y.o.   MRN: [id number]  Chief Complaint  Patient presents with   SVT   New Patient (Initial Visit)   HPI:    Sharon Chapman  is a 52 y.o. Caucasian female patient with Crohn's disease on disease modifying agent therapy, otherwise no significant prior cardiovascular history, chronic anemia related to Crohn's, presented with sustained SVT on 01/10/2023 to her PCPs office terminated by Valsalva maneuver.  She was now referred to me for further evaluation.  Patient has been having symptoms of rapid onset palpitation and offset of palpitations ongoing for the last 25 to 30 years.  However over the last 3 months, symptoms have gotten worse and frequency has increased.  For palpitation presently asymptomatic, continues to remain active.  Past Medical History:  Diagnosis Date   Anxiety    anxiety, ADD mixed    Arthritis    cervical myelopathy , spondylosis, recently reports low back pain    Atypical mole 01/09/2022   severe- (WS)   Depression    Heart murmur    h/o suppsed heart murmur, seen cardiologist about 20 yrs. ago, no f/u since, sees [facility]- Deboraha Sprang  currently, no recenmt mention of heart murmur   Heart murmur    told many yrs. ago that she had a heart murmur, doesn't ever remember having any test related  to the finding    Infiltrative basal cell carcinoma (BCC) 07/04/2017   Left Upper Back (curet and 5FU)   Mental disorder    PSVT (paroxysmal supraventricular tachycardia) 01/10/2023   EKG 01/10/2023 at 11:59 AM: Normal sinus rhythm at rate of 83 bpm, normal EKG.  EKG 01/10/2023 at 11:39 AM supraventricular tachycardia at rate of 177 bpm.  Secondary ST-T abnormality.   Seasonal allergies    Past Surgical History:  Procedure Laterality Date   ANTERIOR CERVICAL DECOMP/DISCECTOMY FUSION  05/27/2012   Procedure: ANTERIOR CERVICAL DECOMPRESSION/DISCECTOMY  FUSION 2 LEVELS;  Surgeon: Cristi Loron, MD;  Location: [facility];  Service: Neurosurgery;  Laterality: N/A;  Cervical five six, cervical six seven anterior cervical decompression with fusion interbody prothesis plating and bonegraft   BREAST ENHANCEMENT SURGERY  2004   CARPAL TUNNEL RELEASE Left 12/06/2013   Procedure: CARPAL TUNNEL RELEASE;  Surgeon: Marlowe Shores, MD;  Location: [facility];  Service: Orthopedics;  Laterality: Left;   CESAREAN SECTION     x2 , spinal , or epidural    EAR CYST EXCISION Left 12/06/2013   Procedure: CYST REMOVAL;  Surgeon: Marlowe Shores, MD;  Location: [facility];  Service: Orthopedics;  Laterality: Left;   KNEE ARTHROSCOPY     left   SINUS ENDO WITH FUSION Bilateral 01/19/2016   Procedure: BILATERAL ETHMOIDECTOMY AND BILATERAL MAXILLARY ANTROSTOMY WITH FUSION NAVIGATION;  Surgeon: Drema Halon, MD;  Location: [facility];  Service: ENT;  Laterality: Bilateral;   TRIGGER FINGER RELEASE Left 12/06/2013   Procedure: LEFT CARPAL TUNNEL RELEASE AND LEFT THUMB A-1 PULLY RELEASE,LEFT WRIST DORSEL MASS EXCISION;  Surgeon: Marlowe Shores, MD;  Location: [facility];  Service: Orthopedics;  Laterality: Left;   TURBINATE REDUCTION Bilateral 01/19/2016   Procedure: TURBINATE REDUCTION;  Surgeon: Drema Halon, MD;  Location: [facility];  Service: ENT;  Laterality: Bilateral;   History reviewed. No pertinent family history.  Social History   Tobacco Use  Smoking status: Former    Packs/day: 1.00    Years: 25.00    Additional pack years: 0.00    Total pack years: 25.00    Types: Cigarettes    Quit date: 2018    Years since quitting: 6.3   Smokeless tobacco: Never   Tobacco comments:    1-1.5 PPD  Substance Use Topics   Alcohol use: Yes    Comment: couple glasses of wine several days of week   Marital Status: Married  ROS  Review of Systems  Cardiovascular:   Positive for palpitations. Negative for chest pain, dyspnea on exertion and leg swelling.   Objective      01/30/2023    1:11 PM 01/30/2023    1:04 PM 07/10/2017    9:36 AM  Vitals with BMI  Height  5' 3.5" 5' 3.5"  Weight  140 lbs 158 lbs 3 oz  BMI  24.41 27.58  Systolic 140 148 161  Diastolic 90 100 100  Pulse 75 75 63      Physical Exam Neck:     Vascular: No carotid bruit or JVD.  Cardiovascular:     Rate and Rhythm: Normal rate and regular rhythm.     Pulses: Intact distal pulses.     Heart sounds: Normal heart sounds. No murmur heard.    No gallop.  Pulmonary:     Effort: Pulmonary effort is normal.     Breath sounds: Normal breath sounds.  Abdominal:     General: Bowel sounds are normal.     Palpations: Abdomen is soft.  Musculoskeletal:     Right lower leg: No edema.     Left lower leg: No edema.     Laboratory examination:   External labs:   Labs 01/27/2023:  Anemia panel normal iron studies.  Anemia panel on 01/13/2023 I reviewed block iron deficiency.  Hb 10.3/HCT 31.9, platelets 773.  Microcytic indicis.  Sodium 135, potassium 4.8, BUN 12, creatinine 0.85, EGFR 80 to mL, magnesium 2.0.  Vitamin D 23.3.  PTH normal.  TSH normal at 2.71.  Lipid profile 08/17/2021:  Total cholesterol 10/17/2010, triglycerides 61, HDL 73, LDL 128.  Radiology:    Cardiac Studies:   NA  EKG:   EKG 01/29/2021: Normal sinus rhythm at rate of 76 bpm, incomplete right bundle branch block.  Otherwise normal EKG.  EKG 01/10/2023 at 11:59 AM: Normal sinus rhythm at rate of 83 bpm, normal EKG.  EKG 01/10/2023 at 11:39 AM supraventricular tachycardia at rate of 177 bpm.  Secondary ST-T abnormality. Medications and allergies   Allergies  Allergen Reactions   Other     Other reaction(s): Other     Medication list   Current Outpatient Medications:    ALPRAZolam (XANAX) 0.5 MG tablet, Take 0.5 mg by mouth at bedtime as needed (heart rhythm). , Disp: , Rfl:     DULoxetine (CYMBALTA) 60 MG capsule, Take 1 capsule by mouth daily., Disp: , Rfl:    IRON-VITAMIN C PO, Take by mouth daily at 6 (six) AM., Disp: , Rfl:    montelukast (SINGULAIR) 10 MG tablet, Take 10 mg by mouth at bedtime., Disp: , Rfl:    pantoprazole (PROTONIX) 40 MG tablet, Take 1 tablet by mouth daily., Disp: , Rfl:    traZODone (DESYREL) 50 MG tablet, TAKE THREE TABLETS BY MOUTH EVERY NIGHT AT BEDTIME Strength: 50 mg, Disp: , Rfl:    Upadacitinib ER (RINVOQ) 45 MG TB24, Take 1 tablet by mouth daily., Disp: , Rfl:  metoprolol tartrate (LOPRESSOR) 25 MG tablet, Take 12.5 mg by mouth 2 (two) times daily. 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 Elevated BP without diagnosis of hypertension  R03.0        Orders Placed This Encounter  Procedures   Ambulatory referral to Cardiac Electrophysiology    Referral Priority:   Routine    Referral Type:   Consultation    Referral Reason:   Specialty Services Required    Requested Specialty:   Cardiology    Number of Visits Requested:   1   EKG 12-Lead   PCV ECHOCARDIOGRAM COMPLETE    Standing Status:   Future    Standing Expiration Date:   01/30/2024    No orders of the defined types were placed in this encounter.   Medications Discontinued During This Encounter  Medication Reason   pantoprazole (PROTONIX) 20 MG tablet Completed Course   BIOTIN PO Completed Course   fluticasone (FLONASE) 50 MCG/ACT nasal spray Completed Course   loratadine (CLARITIN) 10 MG tablet Completed Course   zolpidem (AMBIEN) 10 MG tablet    valACYclovir (VALTREX) 500 MG tablet    SUTAB 432 [id number] MG TABS    Saccharomyces boulardii (PROBIOTIC) 250 MG CAPS    OVER THE COUNTER MEDICATION    Multiple Vitamin tablet    Methylcobalamin 1 MG CHEW    mesalamine  (LIALDA) 1.2 g EC tablet    magnesium gluconate (MAGONATE) 500 MG tablet    HUMIRA PEN-CD/UC/HS STARTER 80 MG/0.8ML PNKT      Recommendations:   Sharon Chapman is a 52 y.o. Caucasian female patient with Crohn's disease on disease modifying agent therapy, otherwise no significant prior cardiovascular history, chronic anemia related to Crohn's, presented with sustained SVT on 01/10/2023 to her PCPs office terminated by Valsalva maneuver.  She was now referred to me for further evaluation.  1. AVNRT (AV nodal re-entry tachycardia) Patient has been having symptoms of rapid onset palpitation and offset of palpitations ongoing for the last 25 to 30 years.  However over the last 3 months, symptoms have gotten worse and frequency has increased.  I extensively discussed with the patient regarding the electrical components of the heart, discussed with her regarding reentry, discussed therapy options.  Although patient states that the episodes are terminated by themselves spontaneously, they have become much more sustained and also much more frequent, in fact she had 1 yesterday and 1 this morning.  She would like to proceed with EP evaluation and possible ablation.  I have discussed with her regarding Valsalva strain, conscious, cold water suppression and also she could continue to use metoprolol on a as needed basis.  I will schedule her for an echocardiogram.  I will make a referral for EP for evaluation and consideration for ablation.  Her EKG is in the media.  As it is purely an electrical issue, I will make her visits as needed, but advised her and reassured her that I will be available to coordinate the care and also answer any other questions if she may have.  I do not think she needs stress testing, she has no cardiovascular risk factors.  Her blood pressure was normal with a PCP visit.  In spite of SVT, she had no ST-T wave changes of  ischemia neither did she have any chest pain or dyspnea.  -  EKG 12-Lead - PCV ECHOCARDIOGRAM COMPLETE; Future - Ambulatory referral to Cardiac Electrophysiology  2.  Elevated blood pressure without diagnosis of hypertension Patient will need monitoring of her blood pressure, she was extremely anxious today when she presented.  Suspect whitecoat hypertension.    Yates Decamp, MD, [facility] 01/30/2023, 2:09 PM Office: [phone]

## 2023-02-24 ENCOUNTER — Ambulatory Visit: Payer: 59

## 2023-02-24 DIAGNOSIS — I4719 Other supraventricular tachycardia: Secondary | ICD-10-CM | POA: Diagnosis not present

## 2023-03-04 ENCOUNTER — Ambulatory Visit: Payer: 59 | Attending: Internal Medicine | Admitting: Internal Medicine

## 2023-03-04 ENCOUNTER — Encounter: Payer: Self-pay | Admitting: Internal Medicine

## 2023-03-04 VITALS — BP 122/84 | HR 66 | Ht 63.5 in | Wt 135.4 lb

## 2023-03-04 DIAGNOSIS — I471 Supraventricular tachycardia, unspecified: Secondary | ICD-10-CM | POA: Diagnosis not present

## 2023-03-04 LAB — CBC WITH DIFFERENTIAL/PLATELET

## 2023-03-04 LAB — BASIC METABOLIC PANEL
BUN: 13 mg/dL (ref 6–24)
CO2: 27 mmol/L (ref 20–29)
Calcium: 9.5 mg/dL (ref 8.7–10.2)
Creatinine, Ser: 0.85 mg/dL (ref 0.57–1.00)

## 2023-03-04 NOTE — Patient Instructions (Signed)
Medication Instructions:  Your physician recommends that you continue on your current medications as directed. Please refer to the Current Medication list given to you today.  *If you need a refill on your cardiac medications before your next appointment, please call your pharmacy*  Lab Work: Your physician recommends that you have lab work today- CBC and BMET  If you have labs (blood work) drawn today and your tests are completely normal, you will receive your results only by: MyChart Message (if you have MyChart) OR A paper copy in the mail If you have any lab test that is abnormal or we need to change your treatment, we will call you to review the results.  Testing/Procedures: Your physician has recommended that you have a SVT ablation. Catheter ablation is a medical procedure used to treat some cardiac arrhythmias (irregular heartbeats). During catheter ablation, a long, thin, flexible tube is put into a blood vessel in your groin (upper thigh), or neck. This tube is called an ablation catheter. It is then guided to your heart through the blood vessel. Radio frequency waves destroy small areas of heart tissue where abnormal heartbeats may cause an arrhythmia to start. Please see the instruction sheet given to you today.   Follow-Up: At [facility], you and your health needs are our priority.  As part of our continuing mission to provide you with exceptional heart care, we have created designated Provider Care Teams.  These Care Teams include your primary Cardiologist (physician) and Advanced Practice Providers (APPs -  Physician Assistants and Nurse Practitioners) who all work together to provide you with the care you need, when you need it.  We recommend signing up for the patient portal called "MyChart".  Sign up information is provided on this After Visit Summary.  MyChart is used to connect with patients for Virtual Visits (Telemedicine).  Patients are able to view lab/test  results, encounter notes, upcoming appointments, etc.  Non-urgent messages can be sent to your provider as well.   To learn more about what you can do with MyChart, go to [email or web address].    Your next appointment:   To be scheduled after procedure  Provider:   You may see Lewayne Bunting, MD or one of the following Advanced Practice Providers on your designated Care Team:   Francis Dowse, [place] "Mardelle Matte" [place], [place] Sherie Don, NP

## 2023-03-04 NOTE — H&P (View-Only) (Signed)
HPI [name] is referred by Dr. Jacinto Halim for evaluation of SVT. She is a pleasant 52 yo woman who has had palpitations for years. She saw Dr. Excell Seltzer 6 years ago but had no SVT on her heart monitor. She was in her usual state of health when she had palptations in the MD's office and was found to be in SVT at 170/min. She broke with [facility]. She has not tolerated her beta blocker. She has sob and chest pressure when her heart races.  Allergies  Allergen Reactions   Other     Other reaction(s): Other     Current Outpatient Medications  Medication Sig Dispense Refill   ALPRAZolam (XANAX) 0.5 MG tablet Take 0.5 mg by mouth at bedtime as needed (heart rhythm).      DULoxetine (CYMBALTA) 60 MG capsule Take 1 capsule by mouth daily.     IRON-VITAMIN C PO Take by mouth daily at 6 (six) AM.     metoprolol tartrate (LOPRESSOR) 25 MG tablet Take 12.5 mg by mouth 2 (two) times daily.     montelukast (SINGULAIR) 10 MG tablet Take 10 mg by mouth at bedtime.     pantoprazole (PROTONIX) 40 MG tablet Take 1 tablet by mouth daily.     traZODone (DESYREL) 50 MG tablet TAKE THREE TABLETS BY MOUTH EVERY NIGHT AT BEDTIME Strength: 50 mg     Upadacitinib ER (RINVOQ) 45 MG TB24 Take 1 tablet by mouth daily.     No current facility-administered medications for this visit.     Past Medical History:  Diagnosis Date   Anxiety    anxiety, ADD mixed    Arthritis    cervical myelopathy , spondylosis, recently reports low back pain    Atypical mole 01/09/2022   severe- (WS)   Depression    Heart murmur    h/o suppsed heart murmur, seen cardiologist about 20 yrs. ago, no f/u since, sees [facility]- Deboraha Sprang  currently, no recenmt mention of heart murmur   Heart murmur    told many yrs. ago that she had a heart murmur, doesn't ever remember having any test related  to the finding    Infiltrative basal cell carcinoma (BCC) 07/04/2017   Left Upper Back (curet and 5FU)   Mental disorder    PSVT (paroxysmal  supraventricular tachycardia) 01/10/2023   EKG 01/10/2023 at 11:59 AM: Normal sinus rhythm at rate of 83 bpm, normal EKG.  EKG 01/10/2023 at 11:39 AM supraventricular tachycardia at rate of 177 bpm.  Secondary ST-T abnormality.   Seasonal allergies     ROS:   All systems reviewed and negative except as noted in the HPI.   Past Surgical History:  Procedure Laterality Date   ANTERIOR CERVICAL DECOMP/DISCECTOMY FUSION  05/27/2012   Procedure: ANTERIOR CERVICAL DECOMPRESSION/DISCECTOMY FUSION 2 LEVELS;  Surgeon: Cristi Loron, MD;  Location: [facility];  Service: Neurosurgery;  Laterality: N/A;  Cervical five six, cervical six seven anterior cervical decompression with fusion interbody prothesis plating and bonegraft   BREAST ENHANCEMENT SURGERY  2004   CARPAL TUNNEL RELEASE Left 12/06/2013   Procedure: CARPAL TUNNEL RELEASE;  Surgeon: Marlowe Shores, MD;  Location: [facility];  Service: Orthopedics;  Laterality: Left;   CESAREAN SECTION     x2 , spinal , or epidural    EAR CYST EXCISION Left 12/06/2013   Procedure: CYST REMOVAL;  Surgeon: Marlowe Shores, MD;  Location: [facility];  Service: Orthopedics;  Laterality: Left;   KNEE ARTHROSCOPY     left   SINUS ENDO WITH FUSION Bilateral 01/19/2016   Procedure: BILATERAL ETHMOIDECTOMY AND BILATERAL MAXILLARY ANTROSTOMY WITH FUSION NAVIGATION;  Surgeon: Drema Halon, MD;  Location: [facility];  Service: ENT;  Laterality: Bilateral;   TRIGGER FINGER RELEASE Left 12/06/2013   Procedure: LEFT CARPAL TUNNEL RELEASE AND LEFT THUMB A-1 PULLY RELEASE,LEFT WRIST DORSEL MASS EXCISION;  Surgeon: Marlowe Shores, MD;  Location: [facility];  Service: Orthopedics;  Laterality: Left;   TURBINATE REDUCTION Bilateral 01/19/2016   Procedure: TURBINATE REDUCTION;  Surgeon: Drema Halon, MD;  Location: [facility];  Service: ENT;  Laterality: Bilateral;      History reviewed. No pertinent family history.   Social History   Socioeconomic History   Marital status: Married    Spouse name: Not on file   Number of children: Not on file   Years of education: Not on file   Highest education level: Not on file  Occupational History   Not on file  Tobacco Use   Smoking status: Former    Packs/day: 1.00    Years: 25.00    Additional pack years: 0.00    Total pack years: 25.00    Types: Cigarettes    Quit date: 2018    Years since quitting: 6.4   Smokeless tobacco: Never   Tobacco comments:    1-1.5 PPD  Vaping Use   Vaping Use: Never used  Substance and Sexual Activity   Alcohol use: Yes    Comment: couple glasses of wine several days of week   Drug use: No   Sexual activity: Not on file  Other Topics Concern   Not on file  Social History Narrative   Not on file   Social Determinants of Health   Financial Resource Strain: Not on file  Food Insecurity: Not on file  Transportation Needs: Not on file  Physical Activity: Not on file  Stress: Not on file  Social Connections: Not on file  Intimate Partner Violence: Not on file     BP 122/84   Pulse 66   Ht 5' 3.5" (1.613 m)   Wt 135 lb 6.4 oz (61.4 kg)   SpO2 99%   BMI 23.61 kg/m   Physical Exam:  Well appearing NAD HEENT: Unremarkable Neck:  No JVD, no thyromegally Lymphatics:  No adenopathy Back:  No CVA tenderness Lungs:  Clear with no wheezes HEART:  Regular rate rhythm, no murmurs, no rubs, no clicks Abd:  soft, positive bowel sounds, no organomegally, no rebound, no guarding Ext:  2 plus pulses, no edema, no cyanosis, no clubbing Skin:  No rashes no nodules Neuro:  CN II through XII intact, motor grossly intact  EKG - nsr with no pre-excitation   Assess/Plan: SVT - I have discussed the treatment options and the indications/risks/benefits/goals/expectations of EP study and ablation were revewed and she wishes to proceed. Sharlot Gowda Taylor,MD

## 2023-03-04 NOTE — Progress Notes (Signed)
HPI Ms. Show is referred by Dr. Jacinto Halim for evaluation of SVT. She is a pleasant 52 yo woman who has had palpitations for years. She saw Dr. Excell Seltzer 6 years ago but had no SVT on her heart monitor. She was in her usual state of health when she had palptations in the MD's office and was found to be in SVT at 170/min. She broke with [facility]. She has not tolerated her beta blocker. She has sob and chest pressure when her heart races.  Allergies  Allergen Reactions   Other     Other reaction(s): Other     Current Outpatient Medications  Medication Sig Dispense Refill   ALPRAZolam (XANAX) 0.5 MG tablet Take 0.5 mg by mouth at bedtime as needed (heart rhythm).      DULoxetine (CYMBALTA) 60 MG capsule Take 1 capsule by mouth daily.     IRON-VITAMIN C PO Take by mouth daily at 6 (six) AM.     metoprolol tartrate (LOPRESSOR) 25 MG tablet Take 12.5 mg by mouth 2 (two) times daily.     montelukast (SINGULAIR) 10 MG tablet Take 10 mg by mouth at bedtime.     pantoprazole (PROTONIX) 40 MG tablet Take 1 tablet by mouth daily.     traZODone (DESYREL) 50 MG tablet TAKE THREE TABLETS BY MOUTH EVERY NIGHT AT BEDTIME Strength: 50 mg     Upadacitinib ER (RINVOQ) 45 MG TB24 Take 1 tablet by mouth daily.     No current facility-administered medications for this visit.     Past Medical History:  Diagnosis Date   Anxiety    anxiety, ADD mixed    Arthritis    cervical myelopathy , spondylosis, recently reports low back pain    Atypical mole 01/09/2022   severe- (WS)   Depression    Heart murmur    h/o suppsed heart murmur, seen cardiologist about 20 yrs. ago, no f/u since, sees [facility]- Deboraha Sprang  currently, no recenmt mention of heart murmur   Heart murmur    told many yrs. ago that she had a heart murmur, doesn't ever remember having any test related  to the finding    Infiltrative basal cell carcinoma (BCC) 07/04/2017   Left Upper Back (curet and 5FU)   Mental disorder    PSVT (paroxysmal  supraventricular tachycardia) 01/10/2023   EKG 01/10/2023 at 11:59 AM: Normal sinus rhythm at rate of 83 bpm, normal EKG.  EKG 01/10/2023 at 11:39 AM supraventricular tachycardia at rate of 177 bpm.  Secondary ST-T abnormality.   Seasonal allergies     ROS:   All systems reviewed and negative except as noted in the HPI.   Past Surgical History:  Procedure Laterality Date   ANTERIOR CERVICAL DECOMP/DISCECTOMY FUSION  05/27/2012   Procedure: ANTERIOR CERVICAL DECOMPRESSION/DISCECTOMY FUSION 2 LEVELS;  Surgeon: Cristi Loron, MD;  Location: [facility];  Service: Neurosurgery;  Laterality: N/A;  Cervical five six, cervical six seven anterior cervical decompression with fusion interbody prothesis plating and bonegraft   BREAST ENHANCEMENT SURGERY  2004   CARPAL TUNNEL RELEASE Left 12/06/2013   Procedure: CARPAL TUNNEL RELEASE;  Surgeon: Marlowe Shores, MD;  Location: [facility];  Service: Orthopedics;  Laterality: Left;   CESAREAN SECTION     x2 , spinal , or epidural    EAR CYST EXCISION Left 12/06/2013   Procedure: CYST REMOVAL;  Surgeon: Marlowe Shores, MD;  Location: [facility];  Service: Orthopedics;  Laterality: Left;   KNEE ARTHROSCOPY     left   SINUS ENDO WITH FUSION Bilateral 01/19/2016   Procedure: BILATERAL ETHMOIDECTOMY AND BILATERAL MAXILLARY ANTROSTOMY WITH FUSION NAVIGATION;  Surgeon: Drema Halon, MD;  Location: [facility];  Service: ENT;  Laterality: Bilateral;   TRIGGER FINGER RELEASE Left 12/06/2013   Procedure: LEFT CARPAL TUNNEL RELEASE AND LEFT THUMB A-1 PULLY RELEASE,LEFT WRIST DORSEL MASS EXCISION;  Surgeon: Marlowe Shores, MD;  Location: [facility];  Service: Orthopedics;  Laterality: Left;   TURBINATE REDUCTION Bilateral 01/19/2016   Procedure: TURBINATE REDUCTION;  Surgeon: Drema Halon, MD;  Location: [facility];  Service: ENT;  Laterality: Bilateral;      History reviewed. No pertinent family history.   Social History   Socioeconomic History   Marital status: Married    Spouse name: Not on file   Number of children: Not on file   Years of education: Not on file   Highest education level: Not on file  Occupational History   Not on file  Tobacco Use   Smoking status: Former    Packs/day: 1.00    Years: 25.00    Additional pack years: 0.00    Total pack years: 25.00    Types: Cigarettes    Quit date: 2018    Years since quitting: 6.4   Smokeless tobacco: Never   Tobacco comments:    1-1.5 PPD  Vaping Use   Vaping Use: Never used  Substance and Sexual Activity   Alcohol use: Yes    Comment: couple glasses of wine several days of week   Drug use: No   Sexual activity: Not on file  Other Topics Concern   Not on file  Social History Narrative   Not on file   Social Determinants of Health   Financial Resource Strain: Not on file  Food Insecurity: Not on file  Transportation Needs: Not on file  Physical Activity: Not on file  Stress: Not on file  Social Connections: Not on file  Intimate Partner Violence: Not on file     BP 122/84   Pulse 66   Ht 5' 3.5" (1.613 m)   Wt 135 lb 6.4 oz (61.4 kg)   SpO2 99%   BMI 23.61 kg/m   Physical Exam:  Well appearing NAD HEENT: Unremarkable Neck:  No JVD, no thyromegally Lymphatics:  No adenopathy Back:  No CVA tenderness Lungs:  Clear with no wheezes HEART:  Regular rate rhythm, no murmurs, no rubs, no clicks Abd:  soft, positive bowel sounds, no organomegally, no rebound, no guarding Ext:  2 plus pulses, no edema, no cyanosis, no clubbing Skin:  No rashes no nodules Neuro:  CN II through XII intact, motor grossly intact  EKG - nsr with no pre-excitation   Assess/Plan: SVT - I have discussed the treatment options and the indications/risks/benefits/goals/expectations of EP study and ablation were revewed and she wishes to proceed. Sharlot Gowda Sonyia Muro,MD

## 2023-03-05 LAB — CBC WITH DIFFERENTIAL/PLATELET
Basophils Absolute: 0 10*3/uL (ref 0.0–0.2)
Hematocrit: 35 % (ref 34.0–46.6)
Hemoglobin: 11.9 g/dL (ref 11.1–15.9)
Immature Grans (Abs): 0.1 10*3/uL (ref 0.0–0.1)
Immature Granulocytes: 1 %
Lymphocytes Absolute: 2 10*3/uL (ref 0.7–3.1)
MCV: 90 fL (ref 79–97)
Monocytes Absolute: 0.5 10*3/uL (ref 0.1–0.9)
Monocytes: 10 %
Neutrophils Absolute: 2.8 10*3/uL (ref 1.4–7.0)
Neutrophils: 51 %
WBC: 5.4 10*3/uL (ref 3.4–10.8)

## 2023-03-05 LAB — BASIC METABOLIC PANEL
BUN/Creatinine Ratio: 15 (ref 9–23)
Chloride: 100 mmol/L (ref 96–106)
Glucose: 91 mg/dL (ref 70–99)
Potassium: 5.3 mmol/L — ABNORMAL HIGH (ref 3.5–5.2)
Sodium: 138 mmol/L (ref 134–144)
eGFR: 82 mL/min/{1.73_m2} (ref >59)

## 2023-04-02 ENCOUNTER — Ambulatory Visit (HOSPITAL_COMMUNITY)
Admission: RE | Disposition: A | Discharge status: Home / Self Care | Payer: Self-pay | Source: Home / Self Care | Attending: Internal Medicine

## 2023-04-02 ENCOUNTER — Encounter (HOSPITAL_COMMUNITY): Payer: Self-pay | Admitting: Internal Medicine

## 2023-04-02 ENCOUNTER — Ambulatory Visit (HOSPITAL_COMMUNITY)
Admission: RE | Admit: 2023-04-02 | Discharge: 2023-04-02 | Disposition: A | Discharge status: Home / Self Care | Payer: 59 | Attending: Internal Medicine | Admitting: Internal Medicine

## 2023-04-02 ENCOUNTER — Other Ambulatory Visit: Payer: Self-pay

## 2023-04-02 DIAGNOSIS — I471 Supraventricular tachycardia, unspecified: Secondary | ICD-10-CM

## 2023-04-02 HISTORY — DX: Asymptomatic menopausal state: Z78.0

## 2023-04-02 HISTORY — PX: SVT ABLATION: EP1225

## 2023-04-02 SURGERY — SVT ABLATION

## 2023-04-02 MED ORDER — MIDAZOLAM HCL 5 MG/5ML IJ SOLN
INTRAMUSCULAR | Status: AC
  Filled 2023-04-02: qty 5

## 2023-04-02 MED ORDER — HEPARIN (PORCINE) IN NACL 1000-0.9 UT/500ML-% IV SOLN
INTRAVENOUS | Status: DC | PRN
  Administered 2023-04-02: 500 mL

## 2023-04-02 MED ORDER — BUPIVACAINE HCL (PF) 0.25 % IJ SOLN
INTRAMUSCULAR | Status: DC | PRN
  Administered 2023-04-02: 30 mL

## 2023-04-02 MED ORDER — SODIUM CHLORIDE 0.9 % IV SOLN: INTRAVENOUS | Status: DC

## 2023-04-02 MED ORDER — MIDAZOLAM HCL 5 MG/5ML IJ SOLN
INTRAMUSCULAR | Status: DC | PRN
  Administered 2023-04-02 (×2): 1 mg via INTRAVENOUS
  Administered 2023-04-02: 2 mg via INTRAVENOUS
  Administered 2023-04-02: 1 mg via INTRAVENOUS

## 2023-04-02 MED ORDER — SODIUM CHLORIDE 0.9% FLUSH: 3.0000 mL | INTRAVENOUS | Status: DC | PRN

## 2023-04-02 MED ORDER — FENTANYL CITRATE (PF) 100 MCG/2ML IJ SOLN
INTRAMUSCULAR | Status: AC
  Filled 2023-04-02: qty 2

## 2023-04-02 MED ORDER — ISOPROTERENOL HCL 0.2 MG/ML IJ SOLN
INTRAMUSCULAR | Status: AC
  Filled 2023-04-02: qty 5

## 2023-04-02 MED ORDER — ACETAMINOPHEN 325 MG PO TABS: 650.0000 mg | ORAL_TABLET | ORAL | Status: DC | PRN

## 2023-04-02 MED ORDER — SODIUM CHLORIDE 0.9 % IV SOLN: 250.0000 mL | INTRAVENOUS | Status: DC | PRN

## 2023-04-02 MED ORDER — BUPIVACAINE HCL (PF) 0.25 % IJ SOLN
INTRAMUSCULAR | Status: AC
  Filled 2023-04-02: qty 60

## 2023-04-02 MED ORDER — SODIUM CHLORIDE 0.9% FLUSH: 3.0000 mL | Freq: Two times a day (BID) | INTRAVENOUS | Status: DC

## 2023-04-02 MED ORDER — ONDANSETRON HCL 4 MG/2ML IJ SOLN: 4.0000 mg | Freq: Four times a day (QID) | INTRAMUSCULAR | Status: DC | PRN

## 2023-04-02 MED ORDER — FENTANYL CITRATE (PF) 100 MCG/2ML IJ SOLN
INTRAMUSCULAR | Status: DC | PRN
  Administered 2023-04-02: 12.5 ug via INTRAVENOUS
  Administered 2023-04-02: 25 ug via INTRAVENOUS
  Administered 2023-04-02 (×2): 12.5 ug via INTRAVENOUS

## 2023-04-02 SURGICAL SUPPLY — 10 items
BAG SNAP BAND KOVER 36X36 (MISCELLANEOUS) IMPLANT
CATH EZ STEER NAV 4MM D-F CUR (ABLATOR) IMPLANT
CATH JOSEPH QUAD ALLRED 6F REP (CATHETERS) IMPLANT
CATH POLARIS X 2.5/5/2.5 DECAP (CATHETERS) IMPLANT
PACK EP LATEX FREE (CUSTOM PROCEDURE TRAY) ×1
PACK EP LF (CUSTOM PROCEDURE TRAY) ×1 IMPLANT
PAD DEFIB RADIO PHYSIO CONN (PAD) ×1 IMPLANT
SHEATH PINNACLE 6F 10CM (SHEATH) IMPLANT
SHEATH PINNACLE 7F 10CM (SHEATH) IMPLANT
SHEATH PINNACLE 8F 10CM (SHEATH) IMPLANT

## 2023-04-02 NOTE — Interval H&P Note (Signed)
History and Physical Interval Note:  04/02/2023 7:58 AM  Sharon Chapman  has presented today for surgery, with the diagnosis of svt.  The various methods of treatment have been discussed with the patient and family. After consideration of risks, benefits and other options for treatment, the patient has consented to  Procedure(s): SVT ABLATION (N/A) as a surgical intervention.  The patient's history has been reviewed, patient examined, no change in status, stable for surgery.  I have reviewed the patient's chart and labs.  Questions were answered to the patient's satisfaction.     Lewayne Bunting

## 2023-04-02 NOTE — Discharge Instructions (Signed)

## 2023-04-02 NOTE — Progress Notes (Signed)
Pt ambulated in hall ~100 ft with no signs of oozing from right groin site  

## 2023-04-02 NOTE — Progress Notes (Signed)
SITE AREA: right femoral/groin  SITE PRIOR TO REMOVAL:  LEVEL 0  PRESSURE APPLIED FOR: approximately 12 minutes   MANUAL: yes   PATIENT STATUS DURING PULL: stable   POST PULL SITE:  LEVEL 0  POST PULL INSTRUCTIONS GIVEN: yes, no running, jumping x 1week, no sitting in water, hot tubs, bath tubs, or pools x 1 week, step easy getting into and out of vehicles, drsg x 24hrs, may shower tomorrow  POST PULL PULSES PRESENT: bilateral pedal pulses palpable at +2  DRESSING APPLIED: gauze with tegaderm  BEDREST BEGINS @ 1104  COMMENTS:SITE AREA:

## 2023-04-03 ENCOUNTER — Encounter (HOSPITAL_COMMUNITY): Payer: Self-pay | Admitting: Internal Medicine

## 2023-04-03 MED FILL — Isoproterenol HCl Inj 0.2 MG/ML: INTRAMUSCULAR | Qty: 5 | Status: AC

## 2023-05-14 ENCOUNTER — Ambulatory Visit: Payer: 59 | Admitting: Internal Medicine

## 2023-07-30 DIAGNOSIS — D225 Melanocytic nevi of trunk: Secondary | ICD-10-CM | POA: Diagnosis not present

## 2023-07-30 DIAGNOSIS — Z872 Personal history of diseases of the skin and subcutaneous tissue: Secondary | ICD-10-CM | POA: Diagnosis not present

## 2023-07-30 DIAGNOSIS — L309 Dermatitis, unspecified: Secondary | ICD-10-CM | POA: Diagnosis not present

## 2023-07-30 DIAGNOSIS — Z79899 Other long term (current) drug therapy: Secondary | ICD-10-CM | POA: Diagnosis not present

## 2023-07-30 DIAGNOSIS — L814 Other melanin hyperpigmentation: Secondary | ICD-10-CM | POA: Diagnosis not present

## 2023-08-25 DIAGNOSIS — K5190 Ulcerative colitis, unspecified, without complications: Secondary | ICD-10-CM | POA: Diagnosis not present

## 2023-09-01 DIAGNOSIS — L609 Nail disorder, unspecified: Secondary | ICD-10-CM | POA: Diagnosis not present

## 2023-09-12 DIAGNOSIS — K5190 Ulcerative colitis, unspecified, without complications: Secondary | ICD-10-CM | POA: Diagnosis not present

## 2023-09-22 DIAGNOSIS — F900 Attention-deficit hyperactivity disorder, predominantly inattentive type: Secondary | ICD-10-CM | POA: Diagnosis not present

## 2023-09-23 DIAGNOSIS — K5190 Ulcerative colitis, unspecified, without complications: Secondary | ICD-10-CM | POA: Diagnosis not present

## 2023-09-25 DIAGNOSIS — L400 Psoriasis vulgaris: Secondary | ICD-10-CM | POA: Diagnosis not present

## 2023-09-26 DIAGNOSIS — Z0184 Encounter for antibody response examination: Secondary | ICD-10-CM | POA: Diagnosis not present

## 2023-09-26 DIAGNOSIS — K5190 Ulcerative colitis, unspecified, without complications: Secondary | ICD-10-CM | POA: Diagnosis not present

## 2023-10-08 DIAGNOSIS — L400 Psoriasis vulgaris: Secondary | ICD-10-CM | POA: Diagnosis not present

## 2023-10-15 DIAGNOSIS — F900 Attention-deficit hyperactivity disorder, predominantly inattentive type: Secondary | ICD-10-CM | POA: Diagnosis not present

## 2023-10-15 DIAGNOSIS — I10 Essential (primary) hypertension: Secondary | ICD-10-CM | POA: Diagnosis not present

## 2023-10-15 DIAGNOSIS — Z01818 Encounter for other preprocedural examination: Secondary | ICD-10-CM | POA: Diagnosis not present

## 2023-10-16 DIAGNOSIS — Z1231 Encounter for screening mammogram for malignant neoplasm of breast: Secondary | ICD-10-CM | POA: Diagnosis not present

## 2023-10-23 DIAGNOSIS — K5190 Ulcerative colitis, unspecified, without complications: Secondary | ICD-10-CM | POA: Diagnosis not present

## 2023-11-01 DIAGNOSIS — I491 Atrial premature depolarization: Secondary | ICD-10-CM | POA: Diagnosis not present

## 2023-11-04 DIAGNOSIS — Z87891 Personal history of nicotine dependence: Secondary | ICD-10-CM | POA: Diagnosis not present

## 2023-11-04 DIAGNOSIS — Z9889 Other specified postprocedural states: Secondary | ICD-10-CM | POA: Diagnosis not present

## 2023-11-04 DIAGNOSIS — J341 Cyst and mucocele of nose and nasal sinus: Secondary | ICD-10-CM | POA: Diagnosis not present

## 2023-11-04 DIAGNOSIS — J0191 Acute recurrent sinusitis, unspecified: Secondary | ICD-10-CM | POA: Diagnosis not present

## 2023-11-07 DIAGNOSIS — Z23 Encounter for immunization: Secondary | ICD-10-CM | POA: Diagnosis not present

## 2023-11-07 DIAGNOSIS — E871 Hypo-osmolality and hyponatremia: Secondary | ICD-10-CM | POA: Diagnosis not present

## 2023-12-08 DIAGNOSIS — Z23 Encounter for immunization: Secondary | ICD-10-CM | POA: Diagnosis not present

## 2023-12-18 DIAGNOSIS — K5190 Ulcerative colitis, unspecified, without complications: Secondary | ICD-10-CM | POA: Diagnosis not present

## 2024-01-11 ENCOUNTER — Emergency Department (HOSPITAL_BASED_OUTPATIENT_CLINIC_OR_DEPARTMENT_OTHER): Admission: EM | Admit: 2024-01-11 | Discharge: 2024-01-11 | Disposition: A | Discharge status: Home / Self Care

## 2024-01-11 ENCOUNTER — Emergency Department (HOSPITAL_BASED_OUTPATIENT_CLINIC_OR_DEPARTMENT_OTHER)

## 2024-01-11 ENCOUNTER — Other Ambulatory Visit: Payer: Self-pay

## 2024-01-11 DIAGNOSIS — S6992XA Unspecified injury of left wrist, hand and finger(s), initial encounter: Secondary | ICD-10-CM | POA: Diagnosis not present

## 2024-01-11 DIAGNOSIS — S61211A Laceration without foreign body of left index finger without damage to nail, initial encounter: Secondary | ICD-10-CM | POA: Insufficient documentation

## 2024-01-11 DIAGNOSIS — S61311A Laceration without foreign body of left index finger with damage to nail, initial encounter: Secondary | ICD-10-CM | POA: Diagnosis not present

## 2024-01-11 DIAGNOSIS — Z0389 Encounter for observation for other suspected diseases and conditions ruled out: Secondary | ICD-10-CM | POA: Diagnosis not present

## 2024-01-11 DIAGNOSIS — W230XXA Caught, crushed, jammed, or pinched between moving objects, initial encounter: Secondary | ICD-10-CM | POA: Insufficient documentation

## 2024-01-11 DIAGNOSIS — Z23 Encounter for immunization: Secondary | ICD-10-CM | POA: Diagnosis not present

## 2024-01-11 MED ORDER — OXYCODONE-ACETAMINOPHEN 5-325 MG PO TABS
1.0000 | ORAL_TABLET | Freq: Once | ORAL | Status: AC
  Administered 2024-01-11: 1 via ORAL
  Filled 2024-01-11: qty 1

## 2024-01-11 MED ORDER — TETANUS-DIPHTH-ACELL PERTUSSIS 5-2.5-18.5 LF-MCG/0.5 IM SUSY
0.5000 mL | PREFILLED_SYRINGE | Freq: Once | INTRAMUSCULAR | Status: AC
  Administered 2024-01-11: 0.5 mL via INTRAMUSCULAR
  Filled 2024-01-11: qty 0.5

## 2024-01-11 MED ORDER — CEPHALEXIN 500 MG PO CAPS: 500.0000 mg | ORAL_CAPSULE | Freq: Two times a day (BID) | ORAL | 0 refills | Status: AC

## 2024-01-11 NOTE — Discharge Instructions (Signed)
 We are prescribing antibiotics.  Please take them for the full course to prevent infection.  Please follow-up with the orthopedic hand surgeon.  You may take over-the-counter medication such as Tylenol  and ibuprofen.  You may wear the splint for comfort.  Your x-ray did not show underlying fractures.  Return if develop fevers, chills, worsening redness, swelling to the whole finger, wound begins draining pus, pain to the entire finger with movement or, you may return if develop any new or worsening symptoms that are concerning to you.

## 2024-01-11 NOTE — ED Triage Notes (Signed)
 Pt states that she was removing screws from a shutter and cut her left index finger yesterday.

## 2024-01-11 NOTE — ED Provider Notes (Signed)
 [place] [facility] DEPARTMENT AT [facility] Provider Note   CSN: [id number] Arrival date & time: 01/11/24  2001     History  Chief Complaint  Patient presents with   Laceration    Sharon Chapman is a 53 y.o. female.  This is a 53 year old female presenting emergency department with left pointer finger laceration.  That her finger smashed in blinds yesterday evening.  Has had pain since that time.  Unsure last tetanus.  Reports some minor numbness to the tip of her finger.   Laceration      Home Medications Prior to Admission medications   Medication Sig Start Date End Date Taking? Authorizing Provider  cephALEXin  (KEFLEX ) 500 MG capsule Take 1 capsule (500 mg total) by mouth 2 (two) times daily for 7 days. 01/11/24 01/18/24 Yes Dexter Signor, Lajean Pike, DO  acetaminophen  (TYLENOL ) 500 MG tablet Take 500-1,000 mg by mouth every 6 (six) hours as needed (pain.).    [provider]  acyclovir ointment (ZOVIRAX) 5 % Apply 1 Application topically every 3 (three) hours as needed (mouth outbreaks).    [provider]  Cholecalciferol (VITAMIN D-3 PO) Take 1 tablet by mouth at bedtime.    [provider]  Cyanocobalamin (VITAMIN B-12 PO) Take 1 tablet by mouth in the morning.    [provider]  DULoxetine (CYMBALTA) 60 MG capsule Take 60 mg by mouth at bedtime.    [provider]  montelukast (SINGULAIR) 10 MG tablet Take 10 mg by mouth at bedtime.    [provider]  Multiple Vitamin (MULTIVITAMIN WITH MINERALS) TABS tablet Take 1 tablet by mouth at bedtime.    [provider]  pantoprazole (PROTONIX) 40 MG tablet Take 40 mg by mouth in the morning. 10/25/21   [provider]  traZODone (DESYREL) 50 MG tablet Take 150-200 mg by mouth at bedtime. 08/17/21   [provider]  Upadacitinib ER 30 MG TB24 Take 30 mg by mouth at bedtime.    [provider]  valACYclovir (VALTREX) 1000 MG tablet Take  1,000 mg by mouth 2 (two) times daily as needed (outbreaks).    [provider]      Allergies    Other    Review of Systems   Review of Systems  Physical Exam Updated Vital Signs BP (!) 118/91   Pulse 83   Temp 98.1 F (36.7 C) (Oral)   Resp 15   LMP 01/12/2016 (Exact Date)   SpO2 98%  Physical Exam Vitals and nursing note reviewed.  Constitutional:      General: She is not in acute distress.    Appearance: She is not toxic-appearing.  HENT:     Head: Normocephalic.     Nose: Nose normal.     Mouth/Throat:     Mouth: Mucous membranes are moist.  Eyes:     Conjunctiva/sclera: Conjunctivae normal.  Cardiovascular:     Rate and Rhythm: Normal rate.  Pulmonary:     Effort: Pulmonary effort is normal.  Musculoskeletal:     Comments: 2 small lacerations to the distal left pointer finger.  Some minor serosanguineous type oozing.  No obvious foreign bodies.  No nailbed injury.  Brisk cap refill.  Dullness to sensation with touch  Neurological:     Mental Status: She is alert.     ED Results / Procedures / Treatments   Labs (all labs ordered are listed, but only abnormal results are displayed) Labs Reviewed - No data to display  EKG None  Radiology DG Hand 2 View Left Result Date: 01/11/2024 EXAM: 1 or 2 VIEW(S) XRAY OF THE LEFT HAND 01/11/2024 09:18:18 PM COMPARISON: None available. CLINICAL HISTORY: First finger; tuft fracture? Patient states that she was removing screws from a shutter and cut her left index finger yesterday. Doctor concerned about a possible tuft fracture. FINDINGS: BONES AND JOINTS: No acute fracture or focal osseous lesion. No joint dislocation. SOFT TISSUES: The soft tissues are unremarkable. IMPRESSION: 1. No acute osseous abnormality. Electronically signed by: Zadie Herter MD 01/11/2024 09:29 PM EDT RP Workstation: [id number]    Procedures Procedures    Medications Ordered in ED Medications  Tdap (BOOSTRIX) injection 0.5 mL  (0.5 mLs Intramuscular Given 01/11/24 2120)  oxyCODONE -acetaminophen  (PERCOCET/ROXICET) 5-325 MG per tablet 1 tablet (1 tablet Oral Given 01/11/24 2120)    ED Course/ Medical Decision Making/ A&P                                 Medical Decision Making 53 year old female presenting emergency department for finger laceration.  Injury occurred greater than 24 hours ago.  Risk of infection outweighs the benefit of closure.  Discussed secondary intention.  Will prescribe antibiotics.  Updated her tetanus today.  X-ray obtained to evaluate for underlying fracture negative.  Patient splinted.  Follow-up with hand surgery outpatient.  Amount and/or Complexity of Data Reviewed Labs:     Details: Considered, however low suspicion for systemic infection.  Did not have signs of flexor tenosynovitis Radiology: ordered.  Risk Prescription drug management.           Final Clinical Impression(s) / ED Diagnoses Final diagnoses:  Laceration of left index finger without foreign body, nail damage status unspecified, initial encounter    Rx / DC Orders ED Discharge Orders          Ordered    cephALEXin  (KEFLEX ) 500 MG capsule  2 times daily        01/11/24 2139              Rolinda Climes, DO 01/11/24 2311

## 2024-01-11 NOTE — ED Notes (Signed)
 Introduced myself to patient. Patient is laying in bed with husband at bedside.

## 2024-01-11 NOTE — ED Notes (Signed)
 X-ray at bedside

## 2024-01-20 DIAGNOSIS — Z01818 Encounter for other preprocedural examination: Secondary | ICD-10-CM | POA: Diagnosis not present

## 2024-02-12 DIAGNOSIS — K5190 Ulcerative colitis, unspecified, without complications: Secondary | ICD-10-CM | POA: Diagnosis not present

## 2024-02-12 DIAGNOSIS — L400 Psoriasis vulgaris: Secondary | ICD-10-CM | POA: Diagnosis not present

## 2024-02-16 ENCOUNTER — Ambulatory Visit (INDEPENDENT_AMBULATORY_CARE_PROVIDER_SITE_OTHER): Admitting: Radiology

## 2024-02-16 ENCOUNTER — Ambulatory Visit: Payer: Self-pay | Admitting: Emergency Medicine

## 2024-02-16 ENCOUNTER — Ambulatory Visit
Admission: RE | Admit: 2024-02-16 | Discharge: 2024-02-16 | Disposition: A | Discharge status: Home / Self Care | Source: Ambulatory Visit | Attending: Emergency Medicine | Admitting: Emergency Medicine

## 2024-02-16 VITALS — BP 136/89 | HR 77 | Temp 98.6°F | Resp 16

## 2024-02-16 DIAGNOSIS — R200 Anesthesia of skin: Secondary | ICD-10-CM | POA: Diagnosis not present

## 2024-02-16 DIAGNOSIS — M4802 Spinal stenosis, cervical region: Secondary | ICD-10-CM | POA: Diagnosis not present

## 2024-02-16 DIAGNOSIS — M545 Low back pain, unspecified: Secondary | ICD-10-CM | POA: Diagnosis not present

## 2024-02-16 DIAGNOSIS — M542 Cervicalgia: Secondary | ICD-10-CM

## 2024-02-16 DIAGNOSIS — M47812 Spondylosis without myelopathy or radiculopathy, cervical region: Secondary | ICD-10-CM | POA: Diagnosis not present

## 2024-02-16 DIAGNOSIS — M47816 Spondylosis without myelopathy or radiculopathy, lumbar region: Secondary | ICD-10-CM | POA: Diagnosis not present

## 2024-02-16 DIAGNOSIS — M4316 Spondylolisthesis, lumbar region: Secondary | ICD-10-CM | POA: Diagnosis not present

## 2024-02-16 DIAGNOSIS — M5432 Sciatica, left side: Secondary | ICD-10-CM

## 2024-02-16 DIAGNOSIS — M4310 Spondylolisthesis, site unspecified: Secondary | ICD-10-CM | POA: Diagnosis not present

## 2024-02-16 MED ORDER — TRIAMCINOLONE ACETONIDE 40 MG/ML IJ SUSP
40.0000 mg | Freq: Once | INTRAMUSCULAR | Status: AC
  Administered 2024-02-16: 40 mg via INTRAMUSCULAR

## 2024-02-16 MED ORDER — METHOCARBAMOL 500 MG PO TABS
500.0000 mg | ORAL_TABLET | Freq: Two times a day (BID) | ORAL | 0 refills | Status: AC
Start: 2024-02-16 — End: ?

## 2024-02-16 NOTE — ED Provider Notes (Signed)
 [facility]    CSN: [id number] Arrival date & time: 02/16/24  1157      History   Chief Complaint Chief Complaint  Patient presents with   Neck Pain    tense   Back Pain    tingling    HPI Sharon Chapman is a 53 y.o. female.   Patient presents to clinic for concerns of right-sided neck pain, intermittent numbness and tingling in the hands and upper arms, low back pain and numbness and tingling that radiates down the back of her left leg.  Patient has a history of cervical disc herniation with a previous surgery in 2016.  She has been having numbness and tingling in her hands and arms for the past few months.  When she turns her neck side-to-side she is having right sided neck pain.  This has been ongoing.  She did have a fall 2 months ago where she jumped off the bed and slipped onto her buttocks when a rug move out from under her, hitting the back of her head on the bed.  Has been having low back pain since the fall, which improved around a month ago but left sided low back pain and numbness that radiates down the back of her leg feels like this is progressing.  Has not had any inner leg numbness, incontinence or weakness.  She has not tried any treatment or interventions for her symptoms.  Did reach out to her orthopedic who did her neck surgery, but she would have to be a new patient to get established with them again.  The history is provided by the patient and medical records.  Neck Pain Back Pain   Past Medical History:  Diagnosis Date   Anxiety    anxiety, ADD mixed    Arthritis    cervical myelopathy , spondylosis, recently reports low back pain    Atypical mole 01/09/2022   severe- (WS)   Depression    Heart murmur    h/o suppsed heart murmur, seen cardiologist about 20 yrs. ago, no f/u since, sees [facility]  currently, no recenmt mention of heart murmur   Heart murmur    told many yrs. 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  EKG 01/10/2023 at 11:39 AM supraventricular tachycardia at rate of 177 bpm.  Secondary ST-T abnormality.   Seasonal allergies     Patient Active Problem List   Diagnosis Date Noted   PSVT (paroxysmal supraventricular tachycardia) (HCC) 01/10/2023   Cervical herniated disc 05/27/2012    Past Surgical History:  Procedure Laterality Date   ANTERIOR CERVICAL DECOMP/DISCECTOMY FUSION  05/27/2012   Procedure: ANTERIOR CERVICAL DECOMPRESSION/DISCECTOMY FUSION 2 LEVELS;  Surgeon: Elder Greening, MD;  Location: [facility];  Service: Neurosurgery;  Laterality: N/A;  Cervical five six, cervical six seven anterior cervical decompression with fusion interbody prothesis plating and bonegraft   BREAST ENHANCEMENT SURGERY  2004   CARPAL TUNNEL RELEASE Left 12/06/2013   Procedure: CARPAL TUNNEL RELEASE;  Surgeon: Hedy Living, MD;  Location: [facility];  Service: Orthopedics;  Laterality: Left;   CESAREAN SECTION  x2 , spinal , or epidural    EAR CYST EXCISION Left 12/06/2013   Procedure: CYST REMOVAL;  Surgeon: Hedy Living, MD;  Location: [facility];  Service: Orthopedics;  Laterality: Left;   KNEE ARTHROSCOPY     left   SINUS ENDO WITH FUSION Bilateral 01/19/2016   Procedure: BILATERAL ETHMOIDECTOMY AND BILATERAL MAXILLARY ANTROSTOMY WITH FUSION NAVIGATION;  Surgeon: Prescott Brodie, MD;  Location: [facility];  Service: ENT;  Laterality: Bilateral;   SVT ABLATION N/A 04/02/2023   Procedure: SVT ABLATION;  Surgeon: Tammie Fall, MD;  Location: [facility];  Service: Cardiovascular;  Laterality: N/A;   TRIGGER  FINGER RELEASE Left 12/06/2013   Procedure: LEFT CARPAL TUNNEL RELEASE AND LEFT THUMB A-1 PULLY RELEASE,LEFT WRIST DORSEL MASS EXCISION;  Surgeon: Hedy Living, MD;  Location: [facility];  Service: Orthopedics;  Laterality: Left;   TURBINATE REDUCTION Bilateral 01/19/2016   Procedure: TURBINATE REDUCTION;  Surgeon: Prescott Brodie, MD;  Location: [facility];  Service: ENT;  Laterality: Bilateral;    OB History   No obstetric history on file.      Home Medications    Prior to Admission medications   Medication Sig Start Date End Date Taking? Authorizing Provider  methocarbamol (ROBAXIN) 500 MG tablet Take 1 tablet (500 mg total) by mouth 2 (two) times daily. 02/16/24  Yes Whittany Parish  N, FNP  acetaminophen  (TYLENOL ) 500 MG tablet Take 500-1,000 mg by mouth every 6 (six) hours as needed (pain.).    [provider]  acyclovir ointment (ZOVIRAX) 5 % Apply 1 Application topically every 3 (three) hours as needed (mouth outbreaks).    [provider]  Cholecalciferol (VITAMIN D-3 PO) Take 1 tablet by mouth at bedtime.    [provider]  Cyanocobalamin (VITAMIN B-12 PO) Take 1 tablet by mouth in the morning.    [provider]  DULoxetine (CYMBALTA) 60 MG capsule Take 60 mg by mouth at bedtime.    [provider]  montelukast (SINGULAIR) 10 MG tablet Take 10 mg by mouth at bedtime.    [provider]  Multiple Vitamin (MULTIVITAMIN WITH MINERALS) TABS tablet Take 1 tablet by mouth at bedtime.    [provider]  pantoprazole (PROTONIX) 40 MG tablet Take 40 mg by mouth in the morning. 10/25/21   [provider]  traZODone (DESYREL) 50 MG tablet Take 150-200 mg by mouth at bedtime. 08/17/21   [provider]  Upadacitinib ER 30 MG TB24 Take 30 mg by mouth at bedtime.    [provider]  valACYclovir (VALTREX) 1000 MG tablet Take 1,000 mg by mouth 2 (two) times daily as  needed (outbreaks).    [provider]    Family History History reviewed. No pertinent family history.  Social History Social History   Tobacco Use   Smoking status: Former    Current packs/day: 0.00    Average packs/day: 1 pack/day for 25.0 years (25.0 ttl pk-yrs)    Types: Cigarettes    Start date: 52    Quit date: 2018    Years since quitting: 7.4   Smokeless tobacco: Never   Tobacco comments:    1-1.5 PPD  Vaping Use   Vaping status: Never Used  Substance Use Topics   Alcohol use: Yes    Comment: couple glasses of wine several days of week   Drug use: No     Allergies   Other  Review of Systems Review of Systems  Per HPI  Physical Exam Triage Vital Signs ED Triage Vitals  Encounter Vitals Group     BP 02/16/24 1211 136/89     Systolic BP Percentile --      Diastolic BP Percentile --      Pulse Rate 02/16/24 1211 77     Resp 02/16/24 1211 16     Temp 02/16/24 1211 98.6 F (37 C)     Temp Source 02/16/24 1211 Oral     SpO2 02/16/24 1211 96 %     Weight --      Height --      Head Circumference --      Peak Flow --      Pain Score 02/16/24 1216 0     Pain Loc --      Pain Education --      Exclude from Growth Chart --    No data found.  Updated Vital Signs BP 136/89 (BP Location: Right Arm)   Pulse 77   Temp 98.6 F (37 C) (Oral)   Resp 16   LMP 01/12/2016 (Exact Date)   SpO2 96%   Visual Acuity Right Eye Distance:   Left Eye Distance:   Bilateral Distance:    Right Eye Near:   Left Eye Near:    Bilateral Near:     Physical Exam Vitals and nursing note reviewed.  Constitutional:      Appearance: Normal appearance.  HENT:     Head: Normocephalic and atraumatic.     Right Ear: External ear normal.     Left Ear: External ear normal.     Nose: Nose normal.     Mouth/Throat:     Mouth: Mucous membranes are moist.  Eyes:     Conjunctiva/sclera: Conjunctivae normal.  Neck:   Cardiovascular:     Rate and Rhythm:  Normal rate.  Pulmonary:     Effort: Pulmonary effort is normal. No respiratory distress.  Musculoskeletal:        General: No swelling or tenderness.     Cervical back: Pain with movement present. No spinous process tenderness.     Thoracic back: No tenderness.     Lumbar back: No tenderness. Positive left straight leg raise test. Negative right straight leg raise test.       Back:  Skin:    General: Skin is warm and dry.  Neurological:     General: No focal deficit present.     Mental Status: She is alert.  Psychiatric:        Mood and Affect: Mood normal.        Behavior: Behavior is cooperative.      UC Treatments / Results  Labs (all labs ordered are listed, but only abnormal results are displayed) Labs Reviewed - No data to display  EKG   Radiology No results found.  Procedures Procedures (including critical care time)  Medications Ordered in UC Medications  triamcinolone acetonide (KENALOG-40) injection 40 mg (has no administration in time range)    Initial Impression / Assessment and Plan / UC Course  I have reviewed the triage vital signs and the nursing notes.  Pertinent labs & imaging results that were available during my care of the patient were reviewed by me and considered in my medical decision making (see chart for details).  Vitals in triage reviewed, patient is hemodynamically stable.  Cervical spine without step-off, tenderness or deformity.  Right sided muscular neck pain with  range of motion.  Cervical spine images by my interpretation do not show acute bony abnormalities, hardware appears to be intact.  Neck pain appears to be musculoskeletal, will trial muscle relaxer.  Intermittent numbness and tingling of hands could be due to compression, advised further evaluation with orthopedics.  Lumbar spine without tenderness to palpation, step-off or deformity.  Left sided low back numbness that radiates down the left leg, worsened with sitting and  worsened with left-sided straight leg raise.  Lumbar imaging does not show acute bony abnormality.  Without inner leg numbness, tingling or concern for cauda equina.  Suspect sciatica, will treat with IM steroid in clinic.  Plan of care, follow-up care return precautions given, no questions at this time.    Final Clinical Impressions(s) / UC Diagnoses   Final diagnoses:  Numbness in left leg  Neck pain on right side  Sciatica of left side     Discharge Instructions      The hardware from your cervical spine surgery appears intact.  I do not see any obvious deformity on your cervical or lumbar spine images.  Official radiology overread will be back over the next few hours and I will call you if there are any updates.  The steroid injection we have given will help with pain and inflammation. Use the muscle relaxer as needed for muscular neck pain.  This may cause drowsiness or sedation.  Heat and gentle stretching can help as well.  Please follow-up with an orthopedic for further evaluation of your ongoing symptoms.    ED Prescriptions     Medication Sig Dispense Auth. Provider   methocarbamol (ROBAXIN) 500 MG tablet Take 1 tablet (500 mg total) by mouth 2 (two) times daily. 20 tablet Harlow Lighter, Tesla Keeler  N, FNP      PDMP not reviewed this encounter.   Harlow Lighter, Hollister Wessler  N, FNP 02/16/24 1312

## 2024-02-16 NOTE — Discharge Instructions (Addendum)
 The hardware from your cervical spine surgery appears intact.  I do not see any obvious deformity on your cervical or lumbar spine images.  Official radiology overread will be back over the next few hours and I will call you if there are any updates.  The steroid injection we have given will help with pain and inflammation. Use the muscle relaxer as needed for muscular neck pain.  This may cause drowsiness or sedation.  Heat and gentle stretching can help as well.  Please follow-up with an orthopedic for further evaluation of your ongoing symptoms.

## 2024-02-16 NOTE — ED Triage Notes (Signed)
 Pt cleans houses. States about 2 months ago she jumped off a bed and slipped on rug. She landed on bottom. Since then she has had tingling from lower back down to feet. She also has had some tension in neck.   States she had surgery on neck in 2016 and is worried about pinched nerve

## 2024-03-10 DIAGNOSIS — L400 Psoriasis vulgaris: Secondary | ICD-10-CM | POA: Diagnosis not present

## 2024-03-10 DIAGNOSIS — K5190 Ulcerative colitis, unspecified, without complications: Secondary | ICD-10-CM | POA: Diagnosis not present

## 2024-04-05 ENCOUNTER — Other Ambulatory Visit (HOSPITAL_BASED_OUTPATIENT_CLINIC_OR_DEPARTMENT_OTHER): Payer: Self-pay

## 2024-04-05 DIAGNOSIS — L400 Psoriasis vulgaris: Secondary | ICD-10-CM | POA: Diagnosis not present

## 2024-04-05 DIAGNOSIS — F419 Anxiety disorder, unspecified: Secondary | ICD-10-CM | POA: Diagnosis not present

## 2024-04-05 DIAGNOSIS — E7800 Pure hypercholesterolemia, unspecified: Secondary | ICD-10-CM | POA: Diagnosis not present

## 2024-04-05 DIAGNOSIS — I10 Essential (primary) hypertension: Secondary | ICD-10-CM | POA: Diagnosis not present

## 2024-04-05 DIAGNOSIS — R110 Nausea: Secondary | ICD-10-CM | POA: Diagnosis not present

## 2024-04-05 DIAGNOSIS — K5900 Constipation, unspecified: Secondary | ICD-10-CM | POA: Diagnosis not present

## 2024-04-05 DIAGNOSIS — K219 Gastro-esophageal reflux disease without esophagitis: Secondary | ICD-10-CM | POA: Diagnosis not present

## 2024-04-05 DIAGNOSIS — F900 Attention-deficit hyperactivity disorder, predominantly inattentive type: Secondary | ICD-10-CM | POA: Diagnosis not present

## 2024-04-05 DIAGNOSIS — K5190 Ulcerative colitis, unspecified, without complications: Secondary | ICD-10-CM | POA: Diagnosis not present

## 2024-04-05 DIAGNOSIS — G4700 Insomnia, unspecified: Secondary | ICD-10-CM | POA: Diagnosis not present

## 2024-04-05 MED ORDER — LISDEXAMFETAMINE DIMESYLATE 70 MG PO CAPS
70.0000 mg | ORAL_CAPSULE | Freq: Every morning | ORAL | 0 refills | Status: DC
  Filled 2024-04-05: qty 30, 30d supply, fill #0

## 2024-04-06 ENCOUNTER — Other Ambulatory Visit (HOSPITAL_BASED_OUTPATIENT_CLINIC_OR_DEPARTMENT_OTHER): Payer: Self-pay

## 2024-04-14 DIAGNOSIS — K219 Gastro-esophageal reflux disease without esophagitis: Secondary | ICD-10-CM | POA: Diagnosis not present

## 2024-04-14 DIAGNOSIS — K5190 Ulcerative colitis, unspecified, without complications: Secondary | ICD-10-CM | POA: Diagnosis not present

## 2024-04-14 DIAGNOSIS — K5904 Chronic idiopathic constipation: Secondary | ICD-10-CM | POA: Diagnosis not present

## 2024-05-05 ENCOUNTER — Other Ambulatory Visit (HOSPITAL_BASED_OUTPATIENT_CLINIC_OR_DEPARTMENT_OTHER): Payer: Self-pay

## 2024-05-05 MED ORDER — LISDEXAMFETAMINE DIMESYLATE 70 MG PO CAPS
70.0000 mg | ORAL_CAPSULE | Freq: Every morning | ORAL | 0 refills | Status: DC
  Filled 2024-05-05 (×2): qty 30, 30d supply, fill #0

## 2024-05-12 DIAGNOSIS — E871 Hypo-osmolality and hyponatremia: Secondary | ICD-10-CM | POA: Diagnosis not present

## 2024-05-12 DIAGNOSIS — N179 Acute kidney failure, unspecified: Secondary | ICD-10-CM | POA: Diagnosis not present

## 2024-05-12 DIAGNOSIS — K5190 Ulcerative colitis, unspecified, without complications: Secondary | ICD-10-CM | POA: Diagnosis not present

## 2024-05-12 DIAGNOSIS — E876 Hypokalemia: Secondary | ICD-10-CM | POA: Diagnosis not present

## 2024-05-13 DIAGNOSIS — A020 Salmonella enteritis: Secondary | ICD-10-CM | POA: Diagnosis not present

## 2024-05-13 DIAGNOSIS — E876 Hypokalemia: Secondary | ICD-10-CM | POA: Diagnosis not present

## 2024-05-13 DIAGNOSIS — K5190 Ulcerative colitis, unspecified, without complications: Secondary | ICD-10-CM | POA: Diagnosis not present

## 2024-05-13 DIAGNOSIS — N179 Acute kidney failure, unspecified: Secondary | ICD-10-CM | POA: Diagnosis not present

## 2024-05-13 DIAGNOSIS — E871 Hypo-osmolality and hyponatremia: Secondary | ICD-10-CM | POA: Diagnosis not present

## 2024-05-14 DIAGNOSIS — N179 Acute kidney failure, unspecified: Secondary | ICD-10-CM | POA: Diagnosis not present

## 2024-05-14 DIAGNOSIS — A020 Salmonella enteritis: Secondary | ICD-10-CM | POA: Diagnosis not present

## 2024-05-14 DIAGNOSIS — E871 Hypo-osmolality and hyponatremia: Secondary | ICD-10-CM | POA: Diagnosis not present

## 2024-05-14 DIAGNOSIS — K5190 Ulcerative colitis, unspecified, without complications: Secondary | ICD-10-CM | POA: Diagnosis not present

## 2024-05-15 DIAGNOSIS — N179 Acute kidney failure, unspecified: Secondary | ICD-10-CM | POA: Diagnosis not present

## 2024-05-15 DIAGNOSIS — E871 Hypo-osmolality and hyponatremia: Secondary | ICD-10-CM | POA: Diagnosis not present

## 2024-05-15 DIAGNOSIS — K5190 Ulcerative colitis, unspecified, without complications: Secondary | ICD-10-CM | POA: Diagnosis not present

## 2024-05-15 DIAGNOSIS — A020 Salmonella enteritis: Secondary | ICD-10-CM | POA: Diagnosis not present

## 2024-05-19 DIAGNOSIS — A09 Infectious gastroenteritis and colitis, unspecified: Secondary | ICD-10-CM | POA: Diagnosis not present

## 2024-05-21 DIAGNOSIS — E876 Hypokalemia: Secondary | ICD-10-CM | POA: Diagnosis not present

## 2024-05-21 DIAGNOSIS — F900 Attention-deficit hyperactivity disorder, predominantly inattentive type: Secondary | ICD-10-CM | POA: Diagnosis not present

## 2024-05-21 DIAGNOSIS — R55 Syncope and collapse: Secondary | ICD-10-CM | POA: Diagnosis not present

## 2024-05-21 DIAGNOSIS — N179 Acute kidney failure, unspecified: Secondary | ICD-10-CM | POA: Diagnosis not present

## 2024-05-21 DIAGNOSIS — A020 Salmonella enteritis: Secondary | ICD-10-CM | POA: Diagnosis not present

## 2024-05-21 DIAGNOSIS — N898 Other specified noninflammatory disorders of vagina: Secondary | ICD-10-CM | POA: Diagnosis not present

## 2024-05-25 DIAGNOSIS — R12 Heartburn: Secondary | ICD-10-CM | POA: Diagnosis not present

## 2024-05-25 DIAGNOSIS — K529 Noninfective gastroenteritis and colitis, unspecified: Secondary | ICD-10-CM | POA: Diagnosis not present

## 2024-05-25 DIAGNOSIS — K2950 Unspecified chronic gastritis without bleeding: Secondary | ICD-10-CM | POA: Diagnosis not present

## 2024-05-25 DIAGNOSIS — K635 Polyp of colon: Secondary | ICD-10-CM | POA: Diagnosis not present

## 2024-05-25 DIAGNOSIS — K2100 Gastro-esophageal reflux disease with esophagitis, without bleeding: Secondary | ICD-10-CM | POA: Diagnosis not present

## 2024-05-25 DIAGNOSIS — K2970 Gastritis, unspecified, without bleeding: Secondary | ICD-10-CM | POA: Diagnosis not present

## 2024-05-25 DIAGNOSIS — K3189 Other diseases of stomach and duodenum: Secondary | ICD-10-CM | POA: Diagnosis not present

## 2024-05-25 DIAGNOSIS — K5100 Ulcerative (chronic) pancolitis without complications: Secondary | ICD-10-CM | POA: Diagnosis not present

## 2024-05-25 DIAGNOSIS — K5190 Ulcerative colitis, unspecified, without complications: Secondary | ICD-10-CM | POA: Diagnosis not present

## 2024-05-28 DIAGNOSIS — M4316 Spondylolisthesis, lumbar region: Secondary | ICD-10-CM | POA: Diagnosis not present

## 2024-05-28 DIAGNOSIS — M542 Cervicalgia: Secondary | ICD-10-CM | POA: Diagnosis not present

## 2024-05-28 DIAGNOSIS — M4312 Spondylolisthesis, cervical region: Secondary | ICD-10-CM | POA: Diagnosis not present
